# Patient Record
Sex: Male | Born: 1955
Health system: Southern US, Community
[De-identification: ages and names within clinical notes are randomized; demographics above are authoritative.]

## PROBLEM LIST (undated history)

## (undated) DIAGNOSIS — C801 Malignant (primary) neoplasm, unspecified: Secondary | ICD-10-CM

## (undated) DIAGNOSIS — N200 Calculus of kidney: Secondary | ICD-10-CM

## (undated) DIAGNOSIS — E1140 Type 2 diabetes mellitus with diabetic neuropathy, unspecified: Secondary | ICD-10-CM

## (undated) DIAGNOSIS — Z87442 Personal history of urinary calculi: Secondary | ICD-10-CM

## (undated) DIAGNOSIS — G5603 Carpal tunnel syndrome, bilateral upper limbs: Secondary | ICD-10-CM

## (undated) DIAGNOSIS — M1990 Unspecified osteoarthritis, unspecified site: Secondary | ICD-10-CM

## (undated) DIAGNOSIS — E119 Type 2 diabetes mellitus without complications: Secondary | ICD-10-CM

## (undated) DIAGNOSIS — E1144 Type 2 diabetes mellitus with diabetic amyotrophy: Secondary | ICD-10-CM

## (undated) DIAGNOSIS — E032 Hypothyroidism due to medicaments and other exogenous substances: Secondary | ICD-10-CM

## (undated) DIAGNOSIS — C61 Malignant neoplasm of prostate: Secondary | ICD-10-CM

## (undated) DIAGNOSIS — R569 Unspecified convulsions: Secondary | ICD-10-CM

## (undated) DIAGNOSIS — R351 Nocturia: Secondary | ICD-10-CM

## (undated) DIAGNOSIS — I2510 Atherosclerotic heart disease of native coronary artery without angina pectoris: Secondary | ICD-10-CM

## (undated) DIAGNOSIS — Z973 Presence of spectacles and contact lenses: Secondary | ICD-10-CM

## (undated) DIAGNOSIS — Z87898 Personal history of other specified conditions: Secondary | ICD-10-CM

## (undated) DIAGNOSIS — E785 Hyperlipidemia, unspecified: Secondary | ICD-10-CM

## (undated) DIAGNOSIS — E291 Testicular hypofunction: Secondary | ICD-10-CM

## (undated) HISTORY — DX: Type 2 diabetes mellitus with diabetic amyotrophy: E11.44

## (undated) HISTORY — DX: Atherosclerotic heart disease of native coronary artery without angina pectoris: I25.10

## (undated) HISTORY — DX: Hypothyroidism due to medicaments and other exogenous substances: E03.2

## (undated) HISTORY — DX: Type 2 diabetes mellitus with diabetic neuropathy, unspecified: E11.40

## (undated) HISTORY — DX: Hyperlipidemia, unspecified: E78.5

## (undated) HISTORY — PX: BRAIN SURGERY: SHX531

---

## 1998-03-20 DIAGNOSIS — I776 Arteritis, unspecified: Secondary | ICD-10-CM

## 1998-03-20 HISTORY — DX: Arteritis, unspecified: I77.6

## 1998-04-06 ENCOUNTER — Other Ambulatory Visit: Admission: RE | Admit: 1998-04-06 | Discharge: 1998-04-06 | Payer: Self-pay | Admitting: Otolaryngology

## 1998-06-24 ENCOUNTER — Encounter: Payer: Self-pay | Admitting: Emergency Medicine

## 1998-06-24 ENCOUNTER — Inpatient Hospital Stay (HOSPITAL_COMMUNITY): Admission: EM | Admit: 1998-06-24 | Discharge: 1998-07-01 | Payer: Self-pay | Admitting: Emergency Medicine

## 1998-06-25 ENCOUNTER — Encounter: Payer: Self-pay | Admitting: Neurology

## 1998-07-20 ENCOUNTER — Encounter: Payer: Self-pay | Admitting: Neurology

## 1998-07-20 ENCOUNTER — Inpatient Hospital Stay (HOSPITAL_COMMUNITY): Admission: AD | Admit: 1998-07-20 | Discharge: 1998-07-23 | Payer: Self-pay | Admitting: Neurology

## 1998-07-27 ENCOUNTER — Inpatient Hospital Stay (HOSPITAL_COMMUNITY): Admission: EM | Admit: 1998-07-27 | Discharge: 1998-07-28 | Payer: Self-pay | Admitting: Emergency Medicine

## 1998-11-19 HISTORY — PX: PROSTATE BIOPSY: SHX241

## 1999-12-16 ENCOUNTER — Emergency Department (HOSPITAL_COMMUNITY): Admission: EM | Admit: 1999-12-16 | Discharge: 1999-12-16 | Payer: Self-pay | Admitting: Emergency Medicine

## 1999-12-16 ENCOUNTER — Encounter: Payer: Self-pay | Admitting: Emergency Medicine

## 1999-12-22 ENCOUNTER — Ambulatory Visit (HOSPITAL_COMMUNITY): Admission: RE | Admit: 1999-12-22 | Discharge: 1999-12-22 | Payer: Self-pay | Admitting: Neurology

## 2001-07-08 ENCOUNTER — Ambulatory Visit (HOSPITAL_BASED_OUTPATIENT_CLINIC_OR_DEPARTMENT_OTHER): Admission: RE | Admit: 2001-07-08 | Discharge: 2001-07-08 | Payer: Self-pay | Admitting: Orthopedic Surgery

## 2001-09-09 ENCOUNTER — Emergency Department (HOSPITAL_COMMUNITY): Admission: EM | Admit: 2001-09-09 | Discharge: 2001-09-09 | Payer: Self-pay | Admitting: *Deleted

## 2002-01-06 ENCOUNTER — Encounter: Admission: RE | Admit: 2002-01-06 | Discharge: 2002-01-06 | Payer: Self-pay | Admitting: Orthopedic Surgery

## 2002-01-06 ENCOUNTER — Encounter: Payer: Self-pay | Admitting: Orthopedic Surgery

## 2003-03-12 ENCOUNTER — Emergency Department (HOSPITAL_COMMUNITY): Admission: EM | Admit: 2003-03-12 | Discharge: 2003-03-12 | Payer: Self-pay | Admitting: Emergency Medicine

## 2003-07-04 ENCOUNTER — Ambulatory Visit (HOSPITAL_BASED_OUTPATIENT_CLINIC_OR_DEPARTMENT_OTHER): Admission: RE | Admit: 2003-07-04 | Discharge: 2003-07-04 | Payer: Self-pay | Admitting: Otolaryngology

## 2003-10-03 ENCOUNTER — Emergency Department (HOSPITAL_COMMUNITY): Admission: EM | Admit: 2003-10-03 | Discharge: 2003-10-03 | Payer: Self-pay | Admitting: *Deleted

## 2003-10-06 ENCOUNTER — Ambulatory Visit (HOSPITAL_BASED_OUTPATIENT_CLINIC_OR_DEPARTMENT_OTHER): Admission: RE | Admit: 2003-10-06 | Discharge: 2003-10-06 | Payer: Self-pay | Admitting: Urology

## 2005-01-23 ENCOUNTER — Ambulatory Visit: Payer: Self-pay | Admitting: Internal Medicine

## 2005-02-14 ENCOUNTER — Encounter (INDEPENDENT_AMBULATORY_CARE_PROVIDER_SITE_OTHER): Payer: Self-pay | Admitting: Specialist

## 2005-02-14 ENCOUNTER — Ambulatory Visit: Payer: Self-pay | Admitting: Internal Medicine

## 2005-03-20 DIAGNOSIS — I219 Acute myocardial infarction, unspecified: Secondary | ICD-10-CM

## 2005-03-20 HISTORY — DX: Acute myocardial infarction, unspecified: I21.9

## 2005-06-18 DIAGNOSIS — I2510 Atherosclerotic heart disease of native coronary artery without angina pectoris: Secondary | ICD-10-CM

## 2005-06-18 HISTORY — DX: Atherosclerotic heart disease of native coronary artery without angina pectoris: I25.10

## 2005-06-22 ENCOUNTER — Inpatient Hospital Stay (HOSPITAL_COMMUNITY): Admission: EM | Admit: 2005-06-22 | Discharge: 2005-06-26 | Payer: Self-pay | Admitting: Emergency Medicine

## 2005-06-22 ENCOUNTER — Ambulatory Visit: Payer: Self-pay | Admitting: Internal Medicine

## 2005-06-22 DIAGNOSIS — I252 Old myocardial infarction: Secondary | ICD-10-CM

## 2005-06-22 DIAGNOSIS — Z955 Presence of coronary angioplasty implant and graft: Secondary | ICD-10-CM

## 2005-06-22 HISTORY — DX: Presence of coronary angioplasty implant and graft: Z95.5

## 2005-06-22 HISTORY — DX: Old myocardial infarction: I25.2

## 2005-06-22 HISTORY — PX: CARDIAC CATHETERIZATION: SHX172

## 2005-06-23 ENCOUNTER — Encounter: Payer: Self-pay | Admitting: Cardiology

## 2005-07-03 ENCOUNTER — Ambulatory Visit: Payer: Self-pay | Admitting: Internal Medicine

## 2005-08-07 ENCOUNTER — Ambulatory Visit: Payer: Self-pay | Admitting: Internal Medicine

## 2005-10-27 ENCOUNTER — Ambulatory Visit: Payer: Self-pay | Admitting: Internal Medicine

## 2006-03-23 ENCOUNTER — Ambulatory Visit: Payer: Self-pay | Admitting: Internal Medicine

## 2006-04-03 ENCOUNTER — Ambulatory Visit: Payer: Self-pay

## 2006-04-03 ENCOUNTER — Ambulatory Visit: Payer: Self-pay | Admitting: Internal Medicine

## 2006-04-03 LAB — CONVERTED CEMR LAB
LDL Cholesterol: 54 mg/dL (ref 0–99)
Total CHOL/HDL Ratio: 3
Triglycerides: 70 mg/dL (ref 0–149)
VLDL: 14 mg/dL (ref 0–40)

## 2006-12-03 ENCOUNTER — Ambulatory Visit: Payer: Self-pay | Admitting: Internal Medicine

## 2006-12-11 ENCOUNTER — Ambulatory Visit: Payer: Self-pay

## 2006-12-21 ENCOUNTER — Ambulatory Visit: Payer: Self-pay | Admitting: Cardiology

## 2006-12-21 ENCOUNTER — Ambulatory Visit (HOSPITAL_COMMUNITY): Admission: RE | Admit: 2006-12-21 | Discharge: 2006-12-21 | Payer: Self-pay | Admitting: Cardiology

## 2007-01-03 ENCOUNTER — Ambulatory Visit: Payer: Self-pay | Admitting: Internal Medicine

## 2007-07-08 ENCOUNTER — Ambulatory Visit: Payer: Self-pay | Admitting: Internal Medicine

## 2007-07-09 ENCOUNTER — Encounter: Payer: Self-pay | Admitting: Internal Medicine

## 2007-07-09 ENCOUNTER — Ambulatory Visit: Payer: Self-pay

## 2007-07-19 ENCOUNTER — Ambulatory Visit: Payer: Self-pay | Admitting: Internal Medicine

## 2007-07-19 LAB — CONVERTED CEMR LAB
Potassium: 4.1 meq/L (ref 3.5–5.1)
Sodium: 138 meq/L (ref 135–145)

## 2007-07-23 ENCOUNTER — Ambulatory Visit: Payer: Self-pay | Admitting: Cardiovascular Disease

## 2007-07-23 ENCOUNTER — Ambulatory Visit (HOSPITAL_COMMUNITY): Admission: RE | Admit: 2007-07-23 | Discharge: 2007-07-23 | Payer: Self-pay | Admitting: Internal Medicine

## 2007-07-23 DIAGNOSIS — Q248 Other specified congenital malformations of heart: Secondary | ICD-10-CM

## 2007-07-23 HISTORY — DX: Other specified congenital malformations of heart: Q24.8

## 2007-11-20 ENCOUNTER — Ambulatory Visit: Payer: Self-pay | Admitting: Urology

## 2008-01-13 ENCOUNTER — Ambulatory Visit: Payer: Self-pay | Admitting: Internal Medicine

## 2008-02-11 ENCOUNTER — Ambulatory Visit: Payer: Self-pay | Admitting: Internal Medicine

## 2008-02-12 ENCOUNTER — Inpatient Hospital Stay (HOSPITAL_COMMUNITY): Admission: EM | Admit: 2008-02-12 | Discharge: 2008-02-13 | Payer: Self-pay | Admitting: Emergency Medicine

## 2008-02-13 ENCOUNTER — Encounter: Payer: Self-pay | Admitting: Cardiology

## 2008-02-20 ENCOUNTER — Encounter: Admission: RE | Admit: 2008-02-20 | Discharge: 2008-02-20 | Payer: Self-pay | Admitting: Internal Medicine

## 2008-02-26 ENCOUNTER — Ambulatory Visit: Payer: Self-pay | Admitting: Internal Medicine

## 2008-02-26 DIAGNOSIS — I2510 Atherosclerotic heart disease of native coronary artery without angina pectoris: Secondary | ICD-10-CM | POA: Insufficient documentation

## 2008-02-26 DIAGNOSIS — E785 Hyperlipidemia, unspecified: Secondary | ICD-10-CM | POA: Insufficient documentation

## 2008-02-26 DIAGNOSIS — E032 Hypothyroidism due to medicaments and other exogenous substances: Secondary | ICD-10-CM | POA: Insufficient documentation

## 2008-04-07 ENCOUNTER — Ambulatory Visit: Payer: Self-pay

## 2008-06-22 ENCOUNTER — Ambulatory Visit: Payer: Self-pay | Admitting: Internal Medicine

## 2008-06-22 ENCOUNTER — Encounter: Payer: Self-pay | Admitting: Internal Medicine

## 2008-08-12 ENCOUNTER — Telehealth: Payer: Self-pay | Admitting: Internal Medicine

## 2008-08-14 ENCOUNTER — Encounter: Payer: Self-pay | Admitting: Internal Medicine

## 2009-01-07 ENCOUNTER — Ambulatory Visit: Payer: Self-pay | Admitting: Internal Medicine

## 2009-01-07 DIAGNOSIS — E669 Obesity, unspecified: Secondary | ICD-10-CM | POA: Insufficient documentation

## 2009-01-07 DIAGNOSIS — I318 Other specified diseases of pericardium: Secondary | ICD-10-CM | POA: Insufficient documentation

## 2009-01-25 ENCOUNTER — Telehealth: Payer: Self-pay | Admitting: Internal Medicine

## 2009-02-05 ENCOUNTER — Telehealth: Payer: Self-pay | Admitting: Internal Medicine

## 2009-04-19 ENCOUNTER — Ambulatory Visit: Payer: Self-pay | Admitting: Internal Medicine

## 2009-04-22 LAB — CONVERTED CEMR LAB
Cholesterol: 104 mg/dL (ref 0–200)
HDL: 31.4 mg/dL — ABNORMAL LOW (ref >39.00)
LDL Cholesterol: 42 mg/dL (ref 0–99)
Total CHOL/HDL Ratio: 3
VLDL: 30.4 mg/dL (ref 0.0–40.0)

## 2009-07-01 ENCOUNTER — Ambulatory Visit: Payer: Self-pay | Admitting: General Practice

## 2009-08-12 ENCOUNTER — Encounter: Payer: Self-pay | Admitting: Internal Medicine

## 2009-08-19 ENCOUNTER — Encounter: Payer: Self-pay | Admitting: Internal Medicine

## 2009-11-15 ENCOUNTER — Ambulatory Visit: Payer: Self-pay | Admitting: Internal Medicine

## 2010-04-19 NOTE — Miscellaneous (Signed)
Summary: crestor don daj  Clinical Lists Changes  Medications: Rx of CRESTOR 10 MG TABS (ROSUVASTATIN CALCIUM) 1 tablet every day before bedtime;  #30 x 6;  Signed;  Entered by: Burnett Kanaris, CNA;  Authorized by: Sherrill Raring, MD, [facility];  Method used: Electronically to [facility] [address] # [id number]*, [address] [place], [place], [place]  [address], Ph: [phone] or [phone], Fax: [phone]    Prescriptions: CRESTOR 10 MG TABS (ROSUVASTATIN CALCIUM) 1 tablet every day before bedtime  #30 x 6   Entered by:   Burnett Kanaris, CNA   Authorized by:   Sherrill Raring, MD, [facility]   Signed by:   Burnett Kanaris, CNA on 08/12/2009   Method used:   Electronically to        [facility]  [id number]* (retail)       [address]       [place], [place]  [address]       Ph: [phone] or [phone]       Fax: [phone]   RxID:   [id number]

## 2010-04-19 NOTE — Assessment & Plan Note (Signed)
Summary: f89m/jss   Visit Type:  Follow-up Primary [name]:  [name]  CC:  none.  History of Present Illness: [name] is a 55 year old gentleman, who has a history of coronary artery disease, hypothyroidism, dyslipidemia, diabetes, pericardial cyst. I last saw him back in October of 2010. He is status post ST elevation MI in April 2007 has 3 bare-metal stents to the circumflex. Repeat catheterization 2009 showed a 60-65% mid RCA stenosis; small RV branch with 80% stenosis. D2 had a 70% ostial lesion. D1 40%. The stents in the LCX had 50% instent proximally.LVEF 55% overall unchanged. Since seen he denies chest pain.  He has been under increased stress having separated from his wife.  Breathing is good.  He is not traveling quite as much.  Not walking regularly.  Current Medications (verified): 1)  Keppra 750 Mg Tabs (Levetiracetam) .... One By Mouth Two Times A Day 2)  Plavix 75 Mg Tabs (Clopidogrel Bisulfate) .... One By Mouth Daily 3)  Metformin Hcl 500 Mg Tabs (Metformin Hcl) .... One By Mouth Two Times A Day 4)  Aspirin 81 Mg Tbec (Aspirin) .... Take One Tablet By Mouth Daily 5)  Metoprolol Succinate 25 Mg Xr24h-Tab (Metoprolol Succinate) .... One By Mouth Daily 6)  Synthroid 150 Mcg Tabs (Levothyroxine Sodium) .... One By Mouth Dialy 7)  Crestor 10 Mg Tabs (Rosuvastatin Calcium) .Marland Kitchen.. 1 Tablet Every Day Before Bedtime  Allergies (verified): No Known Drug Allergies  Past History:  Past medical, surgical, family and social histories (including risk factors) reviewed, and no changes noted (except as noted below).  Past Medical History: Reviewed history from 02/26/2008 and no changes required. 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 Jasper Loser Diabetes Type 2 Pericardial cyst  Family History: Reviewed history from  02/26/2008 and no changes required. Father:MI & CABG Mother:died 81 MI  Social History: Reviewed history from 02/26/2008 and no changes required. Married  Tobacco Use - No.  Alcohol Use - yes Regular Exercise - no  Review of Systems       All systems reviewed.  Neg to the above problem.  Vital Signs:  Patient profile:   55 year old male Height:      71 inches Weight:      257 pounds BMI:     35.97 Pulse rate:   72 / minute BP sitting:   121 / 75  (left arm) Cuff size:   large  Vitals Entered By: Burnett Kanaris, CNA (November 15, 2009 2:37 PM)  Physical Exam  Additional Exam:  Patient is in NAD HEENT:  Normocephalic, atraumatic. EOMI, PERRLA.  Neck: JVP is normal. No thyromegaly. No bruits.  Lungs: clear to auscultation. No rales no wheezes.  Heart: Regular rate and rhythm. Normal S1, S2. No S3.   No significant murmurs. PMI not displaced.  Abdomen:  OBese.Supple, nontender. Normal bowel sounds. No masses. No hepatomegaly.  Extremities:   Good distal pulses throughout. No lower extremity edema.  Musculoskeletal :moving all extremities.  Neuro:   alert and oriented x3.    EKG  Procedure date:  11/15/2009  Findings:      NSR.  75 bpm.  Inf MI  Impression & Recommendations:  Problem # 1:  CAD, NATIVE VESSEL (ICD-414.01) Doing well.  No pains.  I would keep him on the same regimen.  I again will review plavix but with multiple stents (even though BMS) and mld instent restenosis would continue  for now. Encouraged him to increase exercise, watch diet. Will get records from Dr. Lorain Childes F/U in 1 year.  Problem # 2:  HYPERLIPIDEMIA-MIXED (ICD-272.4) check last lipid panel in June.  Problem # 3:  OBESITY, UNSPECIFIED (ICD-278.00) COunsellede

## 2010-08-02 NOTE — Assessment & Plan Note (Signed)
[facility]                            CARDIOLOGY OFFICE NOTE   [name], [name]                       MRN:          [id number]  DATE:01/13/2008                            DOB:          [birth year]    IDENTIFICATION:  [name] is a 55 year old gentleman, I last saw him in  April of this year.  He was initially referred by Dr. Jacky Oconnor for an  abnormal CT, found to have a pericardial cyst.   Since seen, the patient has done okay.  He is still very busy at work,  not getting a lot of exercise, notes occasional chest pains, but when he  is lying in bed, not with activity.  [place], [place] and walks 5 miles at a time without a problem.  He denies  palpitations.   CURRENT MEDICINES:  1. Keppra 750 b.i.d.  2. Synthroid 0.112 alternating with 0.15 daily.  3. Plavix 75.  4. Aspirin 81.  5. Vytorin 10/40.  6. Metoprolol 12.5 b.i.d.  7. Metformin 500 b.i.d.   PHYSICAL EXAMINATION:  GENERAL:  The patient is an obese, 55 year old,  in no distress.  VITAL SIGNS:  Blood pressure is 126/84, pulse is 81.  Weight is 276.  NECK:  No bruits.  LUNGS:  Clear.  No rales.  CARDIAC:  Regular rate and rhythm.  S1 and S2.  No S3, no significant  murmurs.  ABDOMEN:  Benign, obese.  EXTREMITIES:  No edema.   IMPRESSION:  Coronary artery disease.  Catheterization in October 2008  shows patent stents.  The right coronary artery was 50% focal and mid  narrowing, small right ventricular branch with an 80% narrowing.  Left  anterior descending (coronary artery) had a 30% D1, which was small had  a 80% proximal and 50% mid stenosis.  Circumflex has 3 stents with 50%  narrowing throughout.   PLAN:  1. Medical therapy.  Sounds like he is asymptomatic.  I would not      change his regimen.  I have told him to increase his activity.  2. Dyslipidemia.  We will need to get the records from Dr. Lanell Oconnor      office.  3. Diabetes, now on metformin.   To have followup with Dr. Jacky Oconnor.  4. Pericardial cyst.  I will follow clinically now.  There is no      evidence that this should progress.   ADDENDUM:  The patient will be set to be seen in 6-8 months.   We will also refer him to Dietary.  I encouraged weight loss.  He is  thinking of joining [employer] with his children, which I  applauded.     Pricilla Riffle, MD, [facility]  Electronically Signed    PVR/MedQ  DD: 01/13/2008  DT: 01/14/2008  Job #: [id number]   cc:   [name], M.D.

## 2010-08-02 NOTE — Cardiovascular Report (Signed)
[name], DELILLO                ACCOUNT NO.:  [id number]   MEDICAL RECORD NO.:  [id number]          PATIENT TYPE:  OIB   LOCATION:  2855                         FACILITY:  [facility]   PHYSICIAN:  Arturo Morton. Riley Kill, MD, FACCDATE OF BIRTH:  10-02-1955   DATE OF PROCEDURE:  DATE OF DISCHARGE:  12/21/2006                            CARDIAC CATHETERIZATION   INDICATIONS:  Mr. Preece is a very nice 55 year old gentleman who  previously underwent stenting with three stents in the circumflex  coronary artery.  The OM1 was occluded but the large remaining  circumflex was opened successfully, with successful reperfusion therapy.  He has done well.  He has had some shortness of breath, but has gained  some weight and has been less active.  Current study was done after a  radionuclide imaging study revealed some mild lateral ischemia.  He was  brought to the lab for further evaluation.   PROCEDURE:  1. Left heart catheterization.  2. Selective coronary arteriorrhaphy.  3. Selective left ventriculography.   DESCRIPTION OF PROCEDURE:  The patient was brought to the  catheterization laboratory, prepped and draped in usual fashion.  Through an anterior puncture, the right femoral artery was easily  entered.  A 5-French sheath was placed.  A left coronary arteriography  was performed with a JL-3 catheter.  A standard JR-4 was used for the  right coronary artery.  Central aortic left atrial pressures were  measured with pigtail. Ventriculography was then performed in the RAO  projection.  The patient tolerated procedure well, and there were no  complications. He was taken to the holding area in satisfactory clinical  condition.  I carefully compared the films with his previous study.  I  then discussed this with the family.   HEMODYNAMIC DATA:  1. Central aortic pressure 117/76, mean 95.  2. Left atrial pressure 106/12.  3. No gradient or pullback across the aortic valve.   ANGIOGRAPHIC DATA:  1. Ventriculography was done in the RAO projection.  Estimated      ejection fraction will be in the range of 55%.  There was very      minimal inferobasal hypokinesis.  Overall, the LV appeared      preserved.  It did not appear to be significant mitral      regurgitation.  2. The right coronary artery is unchanged from the previous study.      There is about a 50% area of focal narrowing in the mid-vessel.      There is a small right ventricular branch with about 80% narrowing.      There is moderate size posterior descending and a somewhat smaller      posterolateral system, all of which have minimal luminal      irregularity but no critical stenoses.  3. The left main coronary artery is free of critical disease.  4. The left anterior descending artery courses to the apex.  There is      some luminal irregularity throughout the LAD with about 30%      narrowing at the first diagonal and 30% narrowing more  distally.      The very distal portion of the LAD has some diffuse luminal      irregularities.  There is a tiny first diagonal that has about an      80% stenosis proximally and a 50% mid-stenosis.  Second diagonal      probably has about 70% ostial stenosis, but both of these vessels      are quite small.  5. The circumflex provides a tiny first marginal and then has three      long stents going into what was a large, likely OM3.  The stented      vessel has a smooth 50% area of narrowing at the entry of the stent      that is over about 10-mm length.  Following this, the vessel opens      up, and the stents all distally are widely patent.  There is      probably less than 20% narrowing throughout the remainder of the      stents.  The first marginal, as previously noted, fills by      retrograde collaterals.  The stump in the first marginal was never      found during the acute study, and this current study demonstrates      reasonably good collateralization of the first marginal  retrograde.   CONCLUSIONS:  1. Preserved overall left ventricular function with a mild area of      inferobasal hypokinesis.  2. Continued patency of previously placed stents with a 50% area of      smooth entry stenosis, and widely patent stents beyond this area.  3. Collateralization of the first marginal branches on the previous      study.  4. No real significant change in the LAD or right coronaries in the      previous study.   DISPOSITION:  At the present time, the patient is getting ready to  enroll in the cardiac rehab program.  This would be ideal for him.  Continued medical therapy would be indicated.  The circumflex vessel  appears smooth at this point in time.  The stent is widely patent except  for the entry point, and this appears to be only about 50% narrowing.  Given the time frame after the original percutaneous intervention, this  would likely represent a mature and fairly healed area.  Therefore, a  repeat intervention at present would likely not be necessary.  He would  also run the risk of repeat restenosis.  We will continue to follow him  with Dr. Tenny Craw, and should there be a change in his status during  rehabilitation, then had some consideration could be given to a  dilatation of this area.      Arturo Morton. Riley Kill, MD, [facility]  Electronically Signed     TDS/MEDQ  D:  12/21/2006  T:  12/22/2006  Job:  [id number]   cc:   Arturo Morton. Riley Kill, MD, [facility]  Pricilla Riffle, MD, [facility]  Patient Medical Record  Geoffry Paradise, M.D.

## 2010-08-02 NOTE — H&P (Signed)
[name], [name]                ACCOUNT NO.:  [id number]   MEDICAL RECORD NO.:  [id number]          PATIENT TYPE:  OBV   LOCATION:  3703                         FACILITY:  [facility]   PHYSICIAN:  Bevelyn Buckles. Bensimhon, MDDATE OF BIRTH:  04/13/1955   DATE OF ADMISSION:  02/11/2008  DATE OF DISCHARGE:                              HISTORY & PHYSICAL   PRIMARY CARE PHYSICIAN:  Dr. Jacky Kindle.   PRIMARY CARDIOLOGIST:  Pricilla Riffle, MD, [facility]   CHIEF COMPLAINT:  Chest pain.   HISTORY OF PRESENT ILLNESS:  [name] is a 55 year old male with a  history of coronary artery disease.  He has occasional chest pain 1-2  times a week.  It resolves without intervention.  Last p.m. at  approximately 11 p.m., he had onset of left-sided chest pain at rest.  This is his usual chest pain, but it was worse than usual and reached in  8/10.  He describes it as a tightness and states it radiated to his left  shoulder.  It was associated with shortness of breath and slight  diaphoresis, but no nausea or vomiting.  He took a sublingual  nitroglycerin at 1:30 a.m., which decreased his symptoms and he was able  to sleep.  At 4:30, he was awakened by chest pain and took a second  nitroglycerin, which decreased his symptoms to a 6/10.  He called our  office this morning and came to the emergency room as directed.  He has  not had any exertional symptoms recently.  By his own admission because  of back problems and because he does work behind [employer], he exerts  himself very little.  He is currently complaining of chest pain at a  6/10.   PAST MEDICAL HISTORY:  1. ST-elevation MI in April 2007 with 3 bare-mental stents to the      circumflex.  2. History of chest pain with cardiac catheterization in October 2008      showing an EF of 55%, left main okay, LAD 30%, first diagonal 80%      and 50%, second diagonal 70% (both small vessels), circumflex 50%      at the proximal stent edge, and the OM1 was totaled, but  filled by      collateral circulation, medical therapy recommended.  3. Preserved left ventricular function with an EF of 55% in cath.  4. Hypertension.  5. Hyperlipidemia.  6. Obesity.  7. Borderline diabetes.  8. History of a seizure disorder secondary to cerebral vasculitis.  9. Hypothyroidism.   SURGICAL HISTORY:  He is status post cardiac catheterization as well as  right shoulder surgery and cystoscopy.   ALLERGIES:  No known drug allergies.   CURRENT MEDICATIONS:  1. Keppra 750 mg b.i.d.  2. Synthroid 0.15 mg daily.  3. Aspirin 81 mg a day.  4. Plavix 75 mg a day.  5. Metoprolol ER 25 mg a day.  6. Vytorin 10/40 daily.  7. Metformin 500 mg b.i.d.   SOCIAL HISTORY:  He lives in [place] with his wife and works in  Psychologist, clinical.  He has  no history of alcohol, tobacco, or drug abuse.  He  was recently started on metformin by his physician, and he is trying to  eat a low-sugar and carbohydrate heart-healthy diet.   FAMILY HISTORY:  His mother died at age 62 with coronary artery disease  of an MI.  His father is alive in his late 54s with a history of MI and  bypass surgery, but no siblings have heart disease.   REVIEW OF SYSTEMS:  He has had diaphoresis with this episode of chest  pain.  He has some chronic dyspnea on exertion and has not changed  recently.  He snores heavily and occasionally wakes up in the middle of  the night, but we have no report of apnea.  He denies coughing,  wheezing, or syncope.  He has no GI symptoms.  Full 14-point review of  systems is otherwise negative.   PHYSICAL EXAMINATION:  VITAL SIGNS:  Temperature is 98.6, blood pressure  138/84, pulse 86, respiratory rate 20, and O2 saturation 98% on 2 L.  GENERAL:  He is a well-developed, obese white male in no obvious  distress.  HEENT:  Normal.  NECK:  There is no lymphadenopathy, thyromegaly, bruit, or JVD noted.  CV:  His heart is regular in rate and rhythm with an S1 and S2 and a  soft  murmur is noted.  Distal pulses are intact in all 4 extremities and  no femoral bruits are appreciated.  LUNGS:  Essentially clear to auscultation bilaterally.  SKIN:  No rashes or lesions are noted.  ABDOMEN:  Soft and nontender with active bowel sounds.  EXTREMITIES:  There is no cyanosis, clubbing, or edema noted.  MUSCULOSKELETAL:  There is no joint deformity or effusions, and no spine  or CVA tenderness is noted.  NEUROLOGIC:  He is alert and oriented with cranial nerves II through XII  grossly intact.   Chest X-Ray:  No active disease.   LABORATORY VALUES:  INR 1.0, PTT 28, and troponin I less than 0.01.  Hemoglobin 15.2, hematocrit 45, WBC 6.2, and platelets 147.  Sodium 138,  potassium 3.8, chloride 106, CO2 of 23, BUN 7, creatinine 1.01, and  glucose 226.  Hemoglobin A1c and TSH pending.  Total cholesterol 100,  triglycerides 161, HDL 25, and LDL pending.   EKG:  Sinus rhythm, rate 72 with no acute ischemic changes.   IMPRESSION:  [name] was seen today by Dr. Gala Romney.  He is a 52-year-  old male with a history of coronary artery disease, treated medically  who is here with crescendo-type chest pain.  His symptoms are improved  by nitroglycerin.  He has been started on IV nitroglycerin in the  emergency room and we will add heparin as well.  Dr. Gala Romney discussed  cardiac catheterization with [name] and his wife, including the risks  and benefits of the  procedure.  Dr. Gala Romney recommends this procedure, and the patient and  his wife agreed to proceed.  He has been scheduled for cardiac  catheterization this afternoon.  Further evaluation and treatment will  depend on the results of the above testing, and he will be continued on  his home medications with the exception of the metformin.      Theodore Demark, PA-C      Bevelyn Buckles. Bensimhon, MD  Electronically Signed    RB/MEDQ  D:  02/11/2008  T:  02/12/2008  Job:  [id number]   cc:   Dr. Jacky Kindle

## 2010-08-02 NOTE — Assessment & Plan Note (Signed)
[facility]                            CARDIOLOGY OFFICE NOTE   [name], [name]                       MRN:          [id number]  DATE:02/26/2008                            DOB:          1956/01/23    PRIMARY CARDIOLOGIST:  Pricilla Riffle, MD, [facility]   PRIMARY CARE PHYSICIAN:  Geoffry Paradise, MD   HISTORY:  This is a pleasant 55 year old married white male patient who  has a history of coronary artery disease, status post ST elevation MI in  April 2007, treated with 3 bare-metal stents to the circumflex.  He  presented with recurrent chest pain on February 12, 2008, and had repeat  cardiac cath by Dr. Clifton James.  He was found to have a RCA with 60-65%  mid stenosis and a small RV branch with an 80% narrowing, LV function  was 55%.  He had patent circumflex stents.  There was second diagonal  that had a 70% ostial stenosis unchanged from prior cath.  He had 40%  first diagonal and 40% second diagonal.   Dr. Clifton James did not think his chest pain was coming from his coronary  artery disease and recommended ongoing medical therapy.  He says if he  had recurrent chest pain, we should perform a stress test to localize  ischemia.   Since the patient has been at home, he says at weekend he had the chest  pain.  He had been raking leaves and felt like he had a pulled muscle  that went into his chest.  Since his cath he has not had any further  chest pain.  He said he had a long discussion with Dr. Jacky Kindle  concerning his weight and he is committed to joining the [employer] in getting  on a strong exercise program and losing weight.  He was recently  diagnosed with diabetes and would like to come off this medication as  well.   CURRENT MEDICATIONS:  1. Keppra 750 mg b.i.d.  2. Vytorin 10/40 mg daily.  3. Metformin 500 mg b.i.d.  4. Synthroid 150 mcg daily.  5. Aspirin 81 mg daily.  6. Metoprolol 25 mg daily.  7. Amoxicillin for dental.  8. Tylenol for  dental.   PHYSICAL EXAMINATION:  GENERAL:  This is a pleasant, obese, 55 year old  white male in no acute distress.  VITAL SIGNS:  Blood pressures 114/78 and pulse 69 way to 70.  NECK:  Without JVD, HJR, bruit, or thyroid enlargement.  LUNGS:  Clear anterior, posterior, and lateral.  HEART:  Regular rate and rhythm at 70 beats per minute.  Normal S1 and  S2.  No murmur, rub, bruit, thrill, or heave noted.  ABDOMEN:  Obese.  Normoactive bowel sounds throughout.  His right groin without hematoma  or hemorrhage.  EXTREMITIES:  Lower extremities without cyanosis or edema. He has good  distal pulses.   IMPRESSION:  1. Chest pain felt to be musculoskeletal.  Cardiac cath on February 12, 2008, revealed patent circumflex stents, 60-65% right coronary      artery, normal left ventricular function,  40% left anterior      descending, and 70% small second diagonal unchanged from prior      cath.  2. Status post acute myocardial infarction in April 2007, treated with      3 bare-metal stents to the circumflex.  3. Hypertension.  4. Hyperlipidemia.  5. Obesity.  6. Borderline diabetes mellitus.  7. History of seizure disorder secondary to cerebral vasculitis.  8. Hypothyroidism.   PLAN:  The patient is doing quite well from a cardiac standpoint.  He  has had no further chest pain.  He is motivated to lose weight.  He has  joined [employer], and has already lost 6 pounds since he was seen in  October.  He will see Dr. Dietrich Pates back in April or sooner if needed.      Paul Reedy, PA-C  Electronically Signed      Hillis Range, MD  Electronically Signed   ML/MedQ  DD: 02/26/2008  DT: 02/27/2008  Job #: [id number]

## 2010-08-02 NOTE — Cardiovascular Report (Signed)
[name], [name]                ACCOUNT NO.:  [id number]   MEDICAL RECORD NO.:  [id number]          PATIENT TYPE:  INP   LOCATION:  3703                         FACILITY:  [facility]   PHYSICIAN:  Verne Carrow, MDDATE OF BIRTH:  [birth year]   DATE OF PROCEDURE:  02/12/2008  DATE OF DISCHARGE:                            CARDIAC CATHETERIZATION   PRIMARY CARDIOLOGIST:  Pricilla Riffle, MD, [facility]   PROCEDURE PERFORMED:  1. Left heart catheterization.  2. Selective coronary angiography.  3. Left ventricular angiogram.   OPERATOR:  Verne Carrow, MD   INDICATIONS:  Chest pain that is concerning for unstable angina in a  patient with known coronary artery disease with prior stent placement in  the circumflex artery during an acute MI in April 2007.  The patient's  last heart catheterization was performed by Dr. Shawnie Pons on  December 21, 2006, and demonstrated patent stents in the circumflex and  nonobstructive disease in other vessels.  The patient was admitted and  ruled out for myocardial infarction.   ANGIOGRAPHIC FINDINGS:  1. The left main coronary artery bifurcates into the circumflex and      LAD and has no evidence of disease.  2. The left anterior descending artery courses to the apex.  There is      some luminal irregularity throughout the LAD with a 40% stenosis at      the first diagonal and another 40% stenosis at the second diagonal.      The very distal portion of the LAD has some diffuse luminal      irregularities.  The first diagonal is a small-caliber vessel that      has a 60% ostial stenosis and a 40% stenosis in the mid body of      this vessel.  The second diagonal is a small-to-moderate size      vessel that has a 70% ostial stenosis that is unchanged from prior      catheterization.  3. The circumflex artery has small second and third obtuse marginal      branch that is free of disease.  There are prior stents noted in      the proximal  circumflex, mid circumflex, and extending into a large      fourth obtuse marginal branch.  The first obtuse marginal branch is      seen to fill from left collaterals.  This has been noted on prior      catheterizations.  There is a 50% stenosis at the very beginning of      the stented portion in the proximal circumflex.  This is within the      stented segment and is unchanged from the prior catheterization.      There is minimal in-stent restenosis noted in the more distal      portion of the stented segment in the fourth obtuse marginal      branch.  4. The right coronary artery is mostly unchanged from the previous      study.  There is a 60-65% stenosis in the mid vessel.  This is  a      large dominant vessel.  There is a small right ventricular branch      with an 80% narrowing.  There is a moderate size posterior      descending branch and a smaller posterolateral system.  All of      which have minimal luminal irregularities, but no critical      stenosis.  5. Left ventricular angiogram demonstrated normal wall motion with an      ejection fraction estimated at 55%.   HEMODYNAMIC DATA:  Central aortic pressure 139/89.  Left ventricular  pressure 144/19.  End-diastolic pressure 26.   IMPRESSION:  1. Stable triple-vessel native coronary artery disease.  2. Patent stents in the circumflex artery with no change in the in-      stent restenosis.  3. Preserved left ventricular function.   RECOMMENDATIONS:  I recommend continued medical management of this  patient's coronary artery disease.  The right coronary artery lesion is  felt to be 60-65%.  I do not think that this is significantly limiting  flow.  If the patient continues to have angina with exertion, then we  could consider performing a stress test to localize ischemia.      Verne Carrow, MD  Electronically Signed     CM/MEDQ  D:  02/12/2008  T:  02/13/2008  Job:  [id number]   cc:   Pricilla Riffle, MD,  [facility]

## 2010-08-02 NOTE — Discharge Summary (Signed)
[name], [name]                ACCOUNT NO.:  [id number]   MEDICAL RECORD NO.:  [id number]          PATIENT TYPE:  INP   LOCATION:  3703                         FACILITY:  [facility]   PHYSICIAN:  Verne Carrow, MDDATE OF BIRTH:  Nov 22, 1955   DATE OF ADMISSION:  02/11/2008  DATE OF DISCHARGE:  02/13/2008                               DISCHARGE SUMMARY   PRIMARY CARDIOLOGIST:  Pricilla Riffle, MD, [facility]   DISCHARGE DIAGNOSIS:  Chest pain/back pain status post cardiac  catheterization, showing stable multivessel disease with patent stent to  circumflex, preserved left ventricular function with recommendations for  continued medical management at this time.  The patient ruled out for  myocardial infarction by serial enzymes and EKG.   PAST MEDICAL HISTORY:  1. Coronary artery disease.  2. Hypertension.  3. Dyslipidemia.  4. Obesity.  5. Diabetes.  6. Seizure disorder.  7. Hypothyroidism.   HOSPITAL COURSE:  Mr. Ganaway is a 55 year old gentleman with known history  of coronary artery disease who presented complaining of chest discomfort  and associated back discomfort.  The patient was admitted, ruled out  myocardial infarction by EKG; however, symptoms felt to be concerning  for unstable angina.  The patient to the cath lab on February 12, 2008,  results as stated above.  The patient with diabetes, metformin increased  to 750 mg b.i.d. by Dr. Dietrich Pates.  Dr. Clifton James in to see the patient  on day of discharge.  The patient was stable to be discharged home to  follow up in outpatient.  Office will call the patient at home with  appointment date and time.  The patient was given the post cardiac  catheterization supplemental instruction with instructions to increase  his metformin to 750 mg b.i.d., prescription provided.  The patient was  instructed not to resume his metformin until February 15, 2008.  Other  medications as previously prescribed per the medication reconciliation  form.  At the time of discharge, medication reconciliation form was not  be located.  The patient will continue her medications as listed unless  otherwise prescribed.  1. Keppra 700 mg p.o. b.i.d.  2. Synthroid 150 mcg daily.  3. Plavix 75 daily.  4. Toprol-XL 25 daily.  5. Vytorin 10/40 daily.  6. Metformin as instructed.  7. Nitroglycerin p.r.n.  8. Aspirin 81 mg daily.   DURATION OF DISCHARGE ENCOUNTER:  Less than 30 minutes.      Dorian Pod, ACNP      Verne Carrow, MD  Electronically Signed    MB/MEDQ  D:  03/25/2008  T:  03/26/2008  Job:  [id number]   cc:   Geoffry Paradise, M.D.

## 2010-08-02 NOTE — Assessment & Plan Note (Signed)
[facility]                            CARDIOLOGY OFFICE NOTE   DODD, SCHMID                       MRN:          [id number]  DATE:07/08/2007                            DOB:          [birth year]    IDENTIFICATION:  [name] is a 55 year old gentleman with a history of  CAD.  I last saw him back in October.   In the interval, he was seen actually by [name], was having some  abdominal and back pain.  CAT scan was performed at [facility].  This  was a CT of the abdomen.  No acute findings were in the gallbladder.  No  calculus identified.  Pancreas was normal.  Abdominal aorta was normal.  There was hypodensity on the right kidney.  Most importantly, though, on  some images of the heart there was a 4 x 2 x 1 cm mass in the right  pericardial fat abutting the flora.  Question cystic, questions  recommended further evaluation with an MRI.   On talking to the patient today, his abdominal back complaints have  improved.   He is busy traveling.  His diet is not what he would like it to be.  He  does get a little short of breath with some tightness at times.  There  is no change though from the fall.   CURRENT MEDICINES:  1. Keppra 750 b.i.d.  2. Synthroid 0.112 alternating with 0.15 daily.  3. Plavix 75.  4. Aspirin 325.  5. Vytorin 12/1938.  6. Metoprolol 12.5 b.i.d.   PHYSICAL EXAMINATION:  GENERAL:  The patient is in no distress.  VITAL SIGNS:  Blood pressure 114/76, pulse is 71 and regular, weight  281, up from 278 on last visit.  LUNGS:  Clear.  CARDIAC:  Regular rate and rhythm, S1-S2 no S3 no murmurs.  ABDOMEN:  Benign, obese.  No right upper quadrant tenderness.  EXTREMITIES:  No edema.   IMPRESSION:  1. Abnormal CT.  Discussed with Radiology at [facility].  First I would get      an echocardiogram to evaluate.  May indeed to find the lesion      cystic.  If this is not adequate, will set the patient up for a MRI      to be done  at [facility] since it will be easily accessible.  The patient      agrees to this.  2. Coronary artery disease.  Last catheterization back in October of      last year showed 30% proximal LAD, 50% mid LAD, D1 is a tiny vessel      with 80% lesion, D2 70% ostial lesion, left circumflex had patent      stent with 50% narrowing in the stent site.  Distal stents (three      total) were patent.  RV branch had an 80% narrowing but was small,      RCA had a 50% mid vessel.  I would continue on medical therapy.  3. Dyslipidemia.  Last lipid panel in January, LDL was 54, HDL was 34,  triglycerides 70.  Would continue.  Next time he comes, will get a      Lipo-Met 3.  4. Health care maintenance.  Encouraged him to try to lose weight,      increase his activity level.   I will set follow-up again based on the results of the MRI, tentatively  for fall, if the results appear benign.     Pricilla Riffle, MD, [facility]  Electronically Signed    PVR/MedQ  DD: 07/09/2007  DT: 07/09/2007  Job #: [id number]   cc:   Geoffry Paradise, M.D.

## 2010-08-02 NOTE — Assessment & Plan Note (Signed)
[facility] [place] [facility]                            CARDIOLOGY OFFICE NOTE   [name], [name]                       MRN:          [id number]  DATE:02/26/2008                            DOB:          02-Jan-1956    ADDENDUM   PLAN:  The patient is doing quite well from a cardiac standpoint.  He  has had no further chest pain.  He is motivated to lose weight.  He has  joined [employer], and has already lost 6 pounds since he was seen in  October.  He will see Dr. Dietrich Pates back in April or sooner if needed.      Paul Reedy, PA-C       Hillis Range, MD    ML/MedQ  DD: 02/26/2008  DT: 02/26/2008  Job #: [id number]

## 2010-08-02 NOTE — Assessment & Plan Note (Signed)
[place] [facility]                            CARDIOLOGY OFFICE NOTE   Paul, Oconnor                       MRN:          [id number]  DATE:01/03/2007                            DOB:          10-05-1955    IDENTIFICATION:  [name] is a 55 year old gentleman who I saw back on  the 15th of September.  He had a history of coronary artery disease and  was complaining of increased shortness of breath.  Because of his  history I recommended he have a Myoview scan done, which he had done.  He had abnormal EKG findings and with this and some scar ischemia in  inferior wall I recommended repeat cardiac catheterization.   The patient had his cardiac catheterization done on October 3 by Bonnee Quin.  This showed the left main was normal.  LAD had some  irregularities, 30% proximal, D1 was tiny with an 80% stenosis  proximally, 50% mid stenosis, D2 had a 70% ostial lesion.  Circumflex  was stented and had a 50% narrowing at the stent site.  The distal  stents (3 total) were otherwise patent.  The RCA was unchanged from  previous, there was a small RV branch that had an 80% narrowing.  There  was a 50% mid vessel narrowing.  The first OM from the left circumflex  had filled via collaterals.  LV function was preserved.   Based on these findings, plan was for continued medical therapy and  encouraged that he enroll in cardiac rehab.  If, again, the circumflex  stent was patent except for the entry point that had a 50% narrowing, if  he had more symptoms then consideration would be given to dilatation in  this area.   Since procedure the patient has been doing okay.  He went to the  initiation of cardiac rehab but would like to enter into a program in  the [facility] region which is closer to his home.   CURRENT MEDICATIONS:  1. Keppra 750 b.i.d.  2. Synthroid as directed.  3. Plavix 75.  4. Aspirin 325.  5. Vytorin 10/40.  6. Metoprolol 12.5.   PHYSICAL  EXAM:  Patient is in no distress.  Blood pressure 110/72, pulse  is 69, weight 277.  LUNGS:  Clear.  CARDIAC EXAM:  Regular rate and rhythm, S1 S2, no S3.  ABDOMEN:  Obese, benign.  EXTREMITIES:  Right groin without hematoma.  No edema distally.   IMPRESSION:  1. Coronary artery disease.  As noted above, will enter into cardiac      rehabilitation, will see if we can get him into a program.  2. Dyslipidemia.  Encourage weight loss, exercise.  Again, his LDL is      great at 54, HDL is a little low at 34 but exercise, weight loss      will help.  3. Question sleep apnea.  I will address at next visit.   I would like to see him back in February.  Will be in touch with him  regarding rehab.     Pricilla Riffle,  MD, [facility]  Electronically Signed    PVR/MedQ  DD: 01/03/2007  DT: 01/04/2007  Job #: [id number]   cc:   Geoffry Paradise, M.D.

## 2010-08-02 NOTE — Assessment & Plan Note (Signed)
[place] [facility]                            CARDIOLOGY OFFICE NOTE   PAPE, PARSON                       MRN:          [id number]  DATE:12/03/2006                            DOB:          Jul 21, 1955    IDENTIFICATION:  [name] is a 55 year old gentleman with a history of  coronary artery disease.  I last saw him in January.   Since seen the patient has noted some increased shortness of breath, he  attributes it probably to deconditioning as his weight has gone up and  his activity, physical has gone down some.  He does not always give out  with activity but he says after 15 holes of golf (riding cart) he is  done for the day.  He just walked from here to the hospital and did not  have problems with shortness of breath (father is hospitalized after  having an MI last week, planned for intervention this week).   The patient has increased his snoring per his wife's report, has some  gasping at night but no significant increase.   No chest pain at rest.   CURRENT MEDICATIONS:  1. Keppra 750 b.i.d.  2. Synthroid 1/2 mg daily, 0.15 mg every other day.  3. Plavix 75 daily.  4. Aspirin 325 daily.  5. Vytorin 10/40 daily.  6. Metoprolol 12.5 daily.   PHYSICAL EXAMINATION:  Patient is in no distress, blood pressure is  106/70, pulse is 65, weight 278 which is up from 262 back in January,  NECK:  No bruits.  LUNGS:  Clear to auscultation.  CARDIAC:  Regular rate and rhythm, S1-S2, no S3, no murmurs.  ABDOMEN:  Obese, benign.  EXTREMITIES:  No edema, 2+ pulses.   A 12 lead EKG normal sinus rhythm, 60 beats per minute.   IMPRESSION:  1. Coronary artery disease, catheterization back in April of 2007.      Left anterior descending was irregular, 30% proximal.  D1 was a      very small vessel, had an 80% proximal and mid-narrowing.  There      was a 40% - 50% narrowing in the mid left anterior descending, 70%      ostial D2, 40% distal left anterior  descending. Circumflex was      occluded after a small diagonal, there was retrograde filling of      the large marginal branch.  This underwent percutaneous      transluminal coronary angioplasty stent from 100% to 0; there was a      second stenosis of about 80% after a small sub-branch, this also      was taken down to 0; 90% stenosis distally, went down to 0 with      TIMI 3 flow.  The right coronary artery was moderate in size, 50% -      60% stenosis.  Right ventricular branch had an 80% ostial lesion      distal vessel, minor irregularities.  Left ventricular ejection      fraction 45% at the time.  I do not know if his shortness  of breath      is deconditioning or worsening coronary artery disease.  He plans      to enter an exercise program, would recommend a stress Myoview      prior, hold beta blocker the day of the procedure.  2. Dyslipidemia, for now we would keep on same regimen and again he is      going to try to get back into weight loss and this should help.  3. Healthcare maintenance scan, encouraged him to increase his      exercise, get his weight down.  4. Question sleep apnea.  Patient had a sleep study done in [facility]      he thinks, he is going to reflect on this.  He said it was      reportedly negative, he also had a hard time with the mask.  Would      not schedule a sleep study for now but again need to follow      closely.  5. I will tentatively set to see the patient back in 6 months, sooner      if problems develop.  Continue on current regimen.  Will see if he      can be a candidate for continued cardiac rehab.     Pricilla Riffle, MD, [facility]  Electronically Signed    PVR/MedQ  DD: 12/03/2006  DT: 12/03/2006  Job #: [id number]   cc:   Geoffry Paradise, M.D.

## 2010-08-05 NOTE — Assessment & Plan Note (Signed)
[place] [facility]                              CARDIOLOGY OFFICE NOTE   DELOS, KLICH                       MRN:          [id number]  DATE:10/27/2005                            DOB:          September 20, 1955    IDENTIFICATION:  [name] is a 55 year old gentleman status post MI back in  April (lateral).  I last saw him back in May.   In the interval, he has done well.  He has finished the initial rehab  portion at [facility] and did very well with this and has  actually enrolled in their continuing training program.  Note, his weight is  down actually about 10 pounds from previous.  He denies chest pain.  Breathing is okay.  Denies dizziness.   CURRENT MEDICATIONS:  1. Vytorin 10/40 every day.  2. Coreg 6.25 b.i.d.  3. Aspirin 325 every day.  4. Plavix 75 every day.  5. Synthroid 0.112 every other day alternating with 0.15 every other day.  6. Keppra 750 b.i.d.   PHYSICAL EXAMINATION:  GENERAL:  The patient is in no distress.  VITAL SIGNS:  Blood pressure 102/74, pulse is 68, weight 250 down from 261.  NECK:  No bruits.  LUNGS:  Clear.  CARDIAC:  Regular rate and rhythm.  S1 S2.  No S3.  No significant murmurs.  ABDOMEN:  Benign.  EXTREMITIES:  No edema.   Cardiac catheterization, back in April, showed LAD was irregular, had a 30%  narrowing proximally, first diagonal was small with an 80% proximal lesion.  There was a 40-50% mid LAD lesion.  Second diagonal had a 70% ostial lesion.  The circumflex was occluded after the first marginal and underwent non-drug-  eluting stent placement after angioplasty x2.  There was also a 90% stenosis  in a large branch distally, this had angioplasty.  The right coronary artery  was moderate in size.  PLSA branch is filled via collaterals.  RCA had a 50-  60% stenosis.  RV branch had an 80% ostial narrowing.  LVF at the time was  45%.  Echocardiogram EF of 50%.  A 12-lead EKG:  Normal sinus  rhythm, 68  beats per minute.   IMPRESSION:  1. Coronary artery disease.  With the diffuse nature of his disease, I      would keep him on the Plavix and aspirin as well as his other      medicines.  2. Dyslipidemia.  We will need to get a fasting lipid panel.  He can have      this drawn at Dr. Lanell Matar office.  I have given him a prescription.   To simplify his regimen, I have recommended he try Toprol XL 12.5 then 25  every day.  This will be once a day and generic instead of the Coreg.   Otherwise I will set to see him in November, sooner if problems develop.   ADDENDUM:  Again, I applauded him on his continued enrollment in rehab as  well as weight loss.  Paul Riffle, MD, [facility]    PVR/MedQ  DD:  10/27/2005  DT:  10/27/2005  Job #:  [id number]   cc:   Geoffry Paradise, MD

## 2010-08-05 NOTE — Op Note (Signed)
NAME:  [name], [name]                          ACCOUNT NO.:  [id number]   MEDICAL RECORD NO.:  [id number]                   PATIENT TYPE:  AMB   LOCATION:  [facility]                                 FACILITY[facility]  [facility]   PHYSICIAN:  Boston Service, M.D.             DATE OF BIRTH:  [birth year]   DATE OF PROCEDURE:  10/06/2003  DATE OF DISCHARGE:                                 OPERATIVE REPORT   INTERNIST:  Geoffry Paradise, M.D.   UROLOGIST:  Boston Service, M.D.   PREOPERATIVE DIAGNOSIS:  Intractable right costovertebral angle tenderness  due to a 3 mm calculus in the right distal ureter.  Has been present now for  almost 10 days.  See office notes from June 10, 2003, September 29, 2003, October 01, 2003, as well as CT scan from October 03, 2003.   POSTOPERATIVE DIAGNOSIS:  Same.   PROCEDURE:  Cystoscopy, retrograde ureteroscopy with stone manipulation.   ANESTHESIA:  General.   DRAINS:  None.   COMPLICATIONS:  None.   DESCRIPTION OF PROCEDURE:  Patient was prepped and draped in the dorsal  lithotomy position after institution of an adequate level of general  anesthesia.  A well-lubricated 21 French panendoscope was gently inserted at  the urethral meatus.  Normal urethra and sphincter.  Intense bullous edema  in the region of the right ureteral orifice.  Normal configuration at the  left orifice.  Right and left retrogrades were performed.  Normal course and  caliber of the left ureter, pelvis, and calyces with prompt drainage at 3-5  minutes.  The right ureter gave the appearance of a retrocaval ureter with  proximal deviation and a configuration of the number seven.  There was  proximal hydronephrosis with a 3-4 mm filling defect in the distal ureter.  Passage of a guidewire beyond the stone resulted in prompt efflux of  concentrated urine from the right ureteral orifice.  Once this subsided in  about 10-12 minutes, the ureteroscope was inserted along side the guidewire.  The stone  was easily localized, negotiated into the basket, and then  withdrawn.  The ureteroscope was reinserted.  Bullous edema of the distal  ureter but no evidence of  perforation or retained stony fragments.  The scope was advanced to the  limit of the short 6 [place] scope.  No other intraureteral pathology was  identified.  The ureteroscope was withdrawn.  The bladder was drained.  The  patient was given a B&O suppository and returned to recovery in satisfactory  condition.                                               Boston Service, M.D.    RH/MEDQ  D:  10/06/2003  T:  10/06/2003  Job:  [id number]   cc:  Geoffry Paradise, M.D.  [address]  [place]  [place] [address]  Fax: [phone]

## 2010-08-05 NOTE — Op Note (Signed)
[place]  Patient:    [name], [name] Visit Number: [id number] MRN: [id number]          Service Type: DSU Location: [facility] Attending Physician:  Marlowe Kays Page Dictated by:   Illene Labrador. Aplington, M.D. Proc. Date: 07/08/01 Admit Date:  07/08/2001 Discharge Date: 07/08/2001                             Operative Report  PREOPERATIVE DIAGNOSIS:  Chronic impingement syndrome with partial rotator cuff tear, right shoulder.  POSTOPERATIVE DIAGNOSIS:  Chronic impingement syndrome with partial rotator cuff tear, right shoulder.  PROCEDURES: 1. Right shoulder arthroscopy (normal examination). 2. Arthroscopic subacromial decompression, right shoulder.  SURGEON:  Illene Labrador. Aplington, M.D.  ASSISTANT:  Marcie Bal. Troncale, P.A.C.  ANESTHESIA:  General.  PATHOLOGY AND JUSTIFICATION FOR PROCEDURE:  He injured his shoulder in December of last year, has had persistent, progressive pain in the shoulder radiating down toward the elbow.  He has had minimal tenderness at the [facility] joint with fairly normal-looking x-rays on plain films.  MRI has demonstrated significant rotator cuff tendinopathy without evidence of a full-thickness tear.  The glenohumeral joint appeared normal.  Accordingly, he is here today for the above-mentioned surgical procedure.  DESCRIPTION OF PROCEDURE:  Satisfactory general anesthesia.  Semisitting position in the beach chair position on the Schlein frame.  The right shoulder was prepped with Duraprep, draped in a sterile field.  The anatomy of the shoulder was marked out, and a lateral portal, posterior soft spot portal, and subcoronal space were all infiltrated with 0.5% Marcaine with adrenalin. Through a small stab wound and using a blunt trocar, I atraumatically entered the glenohumeral joint, which was normal on evaluation.  Representative pictures were taken.  I then redirected the scope to the subacromial area and through  the lateral portal introduced a 4.2 shaver, where I performed a bursectomy.  There was not a very active bursitis present.  Very prominent, jagged-looking subacromial spurs were noted and pictured.  I then introduced the Arthrocare vaporizer and began removing soft tissue from the underneath surface of the acromion as well as around the perimeter and followed this with a 4.0 oval bur, where I began burring down the underneath surface of the acromion.  I then rotated back and forth between the bur, the Arthrocare vaporizer, and the 4.2 shaver until all soft tissue and impingement problems had been corrected.  The rotator cuff was also gently smoothed down with the shaver.  I then took final pictures with the arm to the side and the arm abducted, showing wide clearance between the subacromial space and the rotator cuff.  Also as part of the decompression I took away part of the underneath surface of the distal clavicle.  The shoulder was then evacuated of all fluid and the two portals closed using 4-0 nylon.  These portals were once again reinjected with Marcaine with adrenalin, as was the subacromial space. Betadine, Adaptic, and a dry sterile dressing were applied, followed by a large sling.  He tolerated the procedure well and at the time of this dictation was on the way to recovery in satisfactory condition with no known complications. Dictated by:   Illene Labrador. Aplington, M.D. Attending Physician:  Joaquin Courts DD:  07/08/01 TD:  07/08/01 Job: [id number]

## 2010-08-05 NOTE — Cardiovascular Report (Signed)
[name], [name]                ACCOUNT NO.:  [id number]   MEDICAL RECORD NO.:  [id number]          PATIENT TYPE:  INP   LOCATION:  2807                         FACILITY:  [facility]   PHYSICIAN:  Arturo Morton. Riley Kill, M.D. [facility] OF BIRTH:  03-31-1955   DATE OF PROCEDURE:  06/22/2005  DATE OF DISCHARGE:                              CARDIAC CATHETERIZATION   INDICATIONS:  Mr. Hilburn is a 55 year old gentleman who is the son of one of  my patients.  The patient presents to the emergency room with onset of chest  pain.  Electrocardiogram subsequently demonstrated acute myocardial  infarction.  He was subsequently seen by Dr. Tenny Craw and brought promptly to  the cardiac catheterization laboratory.  I-STAT was done to check  creatinine, and hematocrit was checked also with the i-STAT.  We explained  the procedure to the patient, quickly examined him.  Urgent catheterization  was recommended.   PROCEDURE:  1.  Left heart catheterization.  2.  Selective coronary arteriography.  3.  Selective left ventriculography.  4.  PTCA and stenting x3 of the circumflex coronary artery with nonDES      vision stents.  5.  Angio-Seal closure of the femoral artery.   DESCRIPTION OF PROCEDURE:  The patient was brought to the catheterization  laboratory and prepped and draped in the usual fashion.  Through an anterior  puncture, the right femoral artery was easily entered.  Following this,  views of the right and left coronary arteries were obtained in multiple  angiographic projections.  The patient had evidence of a total occlusion of  the circumflex coronary artery.  There is also evidence of what appeared to  be possibly a chronic occlusion of one of the marginal branches.  There was  no proximal stump noted.  The patient had been given heparin, with an  appropriate ACT and the administration of double bolus Integrilin.  A  Prowater wire was placed down the vessel, and we were able to establish  reperfusion into the large second marginal system.  With this, balloon  dilatation was done with a 2.25 x 20-mm balloon.  The balloon was taken up  and reperfusion established.  There was a first marginal branch that was  very large and filling by retrograde collaterals; however, there was no  proximal stump.  We probed the area for approximately 10 minutes with a  Prowater wire to see if we could gain access to any type of side branch, but  this was not possible given the resolution of ST-segment changes, resolution  of chest pain, and lack of appropriate stump and filling of the collaterals  promptly, it was felt that this likely represented a chronic occlusion.  We  then subsequently placed a 23 x 2.75 Vision stent.  We elected not to use a  drug-eluting stent initially, subsequent to that, we also felt that given  the long area of treatment that it would probably be optimal not to do this.  There were 2 additional lesions which were dilated with a balloon  subsequently.  The stent was deployed at about 15 atmospheres,  and  postdilated using a 3.25 Quantum Maverick balloon.  The 2 distal lesions  were then dilated with both a 2.5 and 2.75 mm balloons, but there was a fair  amount of elastic recoil and suboptimal results.  We therefore placed a 28 x  2.75 Vision stent distally.  The intermediate area was covered with an 18 x  2.75 stent and was telescoped to the distal end of the proximal stent and  telescoped into the proximal end of the distal stent.  This was taken up to  also 16 atmospheres.  Postdilatation was then done through the entire area.  There was marked improvement in the appearance of the artery.  ACTs were  checked throughout the course of the procedure.  All catheters were then  subsequently removed.  Central aortic and left ventricular pressures were  measured with the pigtail.  Ventriculography was done in the RAO projection.  The patient had resolution of ST-segment  elevation and also had resolution  of chest pain.   HEMODYNAMIC DATA:  1.  Central aortic pressure 169/108 with a mean of 134.  2.  Left ventricular pressure 141/19.  3.  No gradient pullback across the aortic valve.   ANGIOGRAPHIC DATA:  1.  Ventriculography was done in the RAO projection.  Estimated ejection      fraction to be in the range of 45%.  There did not appear to be      significant mitral regurgitation.  2.  The right coronary was a moderate size vessel providing the posterior      descending and posterolateral branch.  This then filled by collaterals      into the AV circumflex which then filled downward into the distal      portion of the circumflex which was occluded.  The right coronary artery      a 50-60% area of mid stenosis.  There was an RV branch that also had 80%      ostial narrowing.  The distal vessel had minor luminal irregularities      but was without critical disease.  3.  The LAD was an irregular vessel.  There was 30% narrowing in the      proximal vessel overlying the first diagonal.  The first diagonal was a      very small vessel with 80% proximal and mid narrowing.  In the mid      vessel, there was a 40-50% area of eccentric narrowing with a 70% ostial      narrowing of the second diagonal.  There was a 40% narrowing of the LAD      distally.  4.  The circumflex was totally occluded after a small first marginal.  There      was retrograde filling of the large marginal branch but no proximal      identification of its takeoff.  Following reperfusion, the 100% stenosis      was reduced to 0%.  There was a second stenosis of about 80% just after      the small subbranch.  This was taken to 0% as well.  There was a 90%      stenosis in the large branch distally, and this was taken to 0% as well.      TIMI III flow was established after TIMI zero flow.  There was      resolution of ST segments and chest pain with restoration of flow.  CONCLUSIONS:   1.  Acute lateral  wall myocardial infarction due to total occlusion of the      circumflex leading into the second obtuse marginal with what appears to      be chronic total occlusion of the first marginal with retrograde      collateralization probably from the left anterior descending.  2.  Scattered disease of the left anterior descending of mild degree with      significant disease involving small diagonal branches.  3.  Moderate stenosis of the mid right coronary artery with      collateralization of the arteriovenous circumflex prior to reperfusion.   DISPOSITION:  The patient has evidence of diffuse disease.  It is not  critical.  Medical therapy will be recommended at the present time.  We  chose non-drug-eluting platform as we placed almost 70 mm of stent in  overlapping fashion.  Given the acute nature of the presentation, it was  felt that this was the optimal approach at the present time.  We will follow  the patient closely.  He may eventually need subsequent treatment.   ADDENDUM:  The patient was prepped and draped, and the gloves were  exchanged.  He was closed with an Angio-Seal device with excellent  hemostasis.      Arturo Morton. Riley Kill, M.D. [facility]  Electronically Signed     TDS/MEDQ  D:  06/22/2005  T:  06/23/2005  Job:  [id number]   cc:   Geoffry Paradise, M.D.  Fax: [phone]   Pricilla Riffle, M.D.  [address]  [address]  [place]  [place] [address]   CV Lab

## 2010-08-05 NOTE — Discharge Summary (Signed)
[name], RADIN NO.:  [id number]   MEDICAL RECORD NO.:  [id number]          PATIENT TYPE:  INP   LOCATION:  3733                         FACILITY:  [facility]   PHYSICIAN:  Pricilla Riffle, M.D.    DATE OF BIRTH:  1956/01/07   DATE OF ADMISSION:  06/22/2005  DATE OF DISCHARGE:  06/26/2005                                 DISCHARGE SUMMARY   PRIMARY CARDIOLOGIST:  Dr. Dietrich Pates   PRIMARY CARE PHYSICIAN:  Dr. Geoffry Paradise   NEUROLOGIST:  Dr. Ron Parker at [facility]   DISCHARGING PHYSICIAN:  Dr. Dietrich Pates   DISCHARGE DIAGNOSES:  1.  Coronary artery disease status post ST elevated myocardial infarction.      1.  Status post cardiac catheterization/PTCA.  Patient had placement of          x3 bare metal stents to the circumflex artery on June 22, 2005 by          Dr. Bonnee Quin.  Patient had total occlusion of the circumflex          leading into the second obtuse marginal with what appeared to be          chronic total occlusion of the first marginal with retrograde          collateralization probably from the left anterior descending artery.          Patient also found to have moderate stenosis of the mid right          coronary artery with collateralization of the arteriovenous          circumflex prior to reperfusion.  Patient has diffuse disease that          is non-critical at this time.      2.  Status post echocardiogram showing an ejection fraction of 50%.  2.  Hypotension with Toprol.  Toprol discontinued.  Patient tolerating a low      dose of Coreg at this time.  3.  Hypothyroidism.  4.  History of seizure disorder secondary to cerebral vasculitis followed by      Dr. Ron Parker at [facility].  5.  Hyperlipidemia/elevated LDL.  Triglycerides 179, LDL 118.  Lipitor 80 mg      initiated.  6.  Obesity/sedentary lifestyle.  7.  Baseline hemoglobin A1c of 6.5.  Will need to recheck in future.  8.  Strong family history of coronary  artery disease.   HOSPITAL COURSE:  [name] is a very pleasant 55 year old Caucasian male  with no known history of coronary artery disease, however, with strong  family history of multiple risk factors.  He presented to the [facility]  Emergency Department with complaints of chest pain.  EKG found patient to be  having an inferior segment ST elevated MI.  Patient was taken emergently to  the cardiac catheterization laboratory for intervention to be performed by  Dr. Riley Kill.  He was placed on aspirin, Plavix, beta blocker, Statin therapy  as tolerated.  Cardiac catheterization results as stated above.  Patient  tolerated procedure without complications.  Patient stable.  Transferred to  telemetry unit.  2-D echocardiogram performed on June 23, 2005.  Left  ventricular ejection fraction estimated at 50%.  The study was inadequate  for the evaluation of left ventricular region wall motion.  There was mild  mitral anular calcification.  There is hypokinesis of the inferior/posterior  wall.  The patient with episodes of dizziness and lightheadedness secondary  to initiation of Toprol.  Toprol discontinued.  Patient started on low dose  Coreg which the patient seems to be tolerating.  At this time we will hold  off on an ACE inhibitor and reevaluate in office setting if blood pressure  tolerates.  Cardiac rehabilitation in to educate patient.  Patient  ambulating in hallway without difficulties, denying any chest pain or  shortness of breath.  Dr. Dietrich Pates in to see patient on day of discharge.  Patient afebrile.  Telemetry showing sinus rhythm in the 70s.  Blood  pressure 120/75.  Prior to discharge blood work:  Chemistries 137, potassium  3.9, glucose 121, BUN 13, creatinine 1.3.  CBC:  White blood cell count 8.6,  hemoglobin 15.3, hematocrit 43.2 with a platelet count of 170,000.  Hemoglobin A1c 6.3.  Lipid profile:  Total cholesterol 190, triglycerides  179, HDL 36, LDL 118.  TSH 3.199.   Cardiac enzymes:  Troponin peaked at  67.08.  No laboratory work pending at time of discharge.   DISPOSITION:  Patient being discharged home.  He will follow up with Dr.  Tenny Craw' _____ on April 16 at 11:30.  He will need blood work done, Building services engineer May 7 at 11 a.m.  Patient is instructed to remain n.p.o. for eight  hours prior to blood work on May 7.  Patient states he has a follow-up  appointment with Dr. Jacky Kindle the first week in May for a yearly physical  examination.  I instructed patient to keep this appointment.  Follow up with  Dr. Ron Parker as needed or already scheduled.   RESTRICTIONS AT TIME OF DISCHARGE:  Patient is instructed to follow a low  fat, low salt, low sugar diet.  Increase activity slowly.  He may shower.  No tub bathing x2 days.  No sexual activity for one week.  No driving for  one week.  No lifting over 10 pounds x1 week.  He has also been given a post  cardiac catheterization discharge instructions and the post myocardial  infarction contract.   PAIN MANAGEMENT:  Nitroglycerin as needed.   MEDICATIONS:  1.  Plavix 75 mg daily at least 30 days, longer if instructed by Dr.      Riley Kill.  2.  Aspirin 325 mg daily.  3.  Lipitor 80 mg daily.  4.  Coreg 3.125 mg p.o. b.i.d.  5.  Synthroid 125 mcg daily.  6.  Keppra 750 mg p.o. b.i.d. or as previously prescribed.   DURATION OF DISCHARGE ENCOUNTER:  45 minutes.      Dorian Pod, NP    ______________________________  Pricilla Riffle, M.D.    MB/MEDQ  D:  06/26/2005  T:  06/26/2005  Job:  [id number]   cc:   Geoffry Paradise, M.D.  Fax: [phone]   [place], M.D.

## 2010-08-05 NOTE — Assessment & Plan Note (Signed)
[facility]                            CARDIOLOGY OFFICE NOTE   DESTIN, VINSANT                       MRN:          [id number]  DATE:03/23/2006                            DOB:          11/04/1955    IDENTIFICATION:  [name] is a 55 year old gentleman who I last saw back  in August. He has a history of an MI in April, 2007.   Since seen he has been doing well from a cardiac stand-point. He denies  chest pain, bilateral significant shortness of breath, his weight is up  though. He is active however, walking on his job.   He notes occasional left-sided headaches, temporal, also has some  tingling in the left side of his face on and off.   CURRENT MEDICATIONS:  1. Keppra 750 b.i.d.  2. Synthroid 0.112 every other day alternating with 0.15 other days.  3. Plavix 75 daily.  4. Aspirin 325 daily.  5. Vytorin 10/40 daily.  6. Toprol XL 12.5 daily.   PHYSICAL EXAMINATION:  GENERAL: The patient is in no distress.  VITAL SIGNS: Blood pressure 114/63, pulse 63, weight 262 up from 150  last visit.  NECK: No bruits.  HEENT: Normocephalic, atraumatic. EOMI. PERRL.  NEURO: Cranial nerves II-XII intact.  LUNGS: Clear.  CARDIAC EXAM: Regular rate and rhythm S1, S2. No S3, no murmurs.  ABDOMEN: Obese, benign.  EXTREMITIES: No edema.   IMPRESSION:  1. Coronary artery disease.  Cardiac catheterization in April showed      irregularities of the LAD diagonal with smaller than 8% proximal      lesion. There was a 40-50% mid LAD. Second diagonal had a 70%      ostial lesion. The circumflex was occluded after the first marginal      had a non drug eluting stent placed there. There was a 90% stenosis      in a large branch distally that had angioplasty done. The RCA was      moderate in size, POSA fills via collaterals. RCA had a 50-60%      stenosis, RV branch had an 80% ostial narrowing, LVF was 45%, by      echo, 50%.   Clinically doing well. I would  keep him on the current regimen.   1. Dyslipidemia. I have signed for fasting lipid panel to be done at      Dr. Lanell Matar office, he will have it faxed here.  2. Headaches, mouth tingling. I will set him up for carotid Doppler's,      nothing beyond that for now.   Tentatively set followup for August, sooner if problems develop.     Pricilla Riffle, MD, [facility]  Electronically Signed    PVR/MedQ  DD: 03/23/2006  DT: 03/23/2006  Job #: [id number]   cc:   Geoffry Paradise, M.D.

## 2010-08-05 NOTE — H&P (Signed)
[name], [name] NO.:  [id number]   MEDICAL RECORD NO.:  [id number]          PATIENT TYPE:  EMS   LOCATION:  MAJO                         FACILITY:  [facility]   PHYSICIAN:  Pricilla Riffle, M.D.    DATE OF BIRTH:  1955-09-07   DATE OF ADMISSION:  06/22/2005  DATE OF DISCHARGE:                                HISTORY & PHYSICAL   PRIMARY CARE PHYSICIAN:  Geoffry Paradise, M.D.   NEUROLOGIST:  Dr. Ron Parker at [facility].   PRIMARY CARDIOLOGIST:  The patient is new to [facility] (Dr. Dietrich Pates).   PATIENT PROFILE:  A 55 year old white male with no prior history of CAD who  presents to the ED with chest pain and inferior ST segment elevation.   PROBLEMS:  1.  Inferior ST segment elevation MI.  2.  Obesity.  3.  Hypothyroidism.  4.  History of seizure disorder secondary to cerebral vasculitis.   HISTORY OF PRESENT ILLNESS:  A 55 year old white male with no prior history  of CAD.  He reports a couple of weeks ago [place] airport and was  hurrying and developed some substernal chest discomfort which resolved with  rest.  He did not think anything of it.  Today while at work, he was sitting  at his desk just doing some work and developed a 6/10 mid scapular and  anterior chest pressure without associated symptoms, lasting about 30  minutes and resolving spontaneously.  At about 3:30 p.m., he had recurrent  symptoms and went home.  He called Dr. Jacky Kindle and was advised to present to  the [facility] ED.  In the ED, ECG revealed inferior ST segment elevation  with reciprocal changes anteriorly as well as intermittent ST elevation of  V5-6.  We were called for a code __________.  The patient is currently  complaining of 2/10 chest pain.  He is given a 5000 units heparin bolus as  well as initiated on 5 mcg of IV nitroglycerin and given 300 mg of p.o.  Plavix.  The cardiac cath lab was activated, and he is taken to the cath lab  emergently.   ALLERGIES:  No known drug allergies.   HOME MEDICATIONS:  Synthroid and Keppra.   FAMILY HISTORY:  Father has a history of CAD, MI, and CABG.  Mother died of  a heart attack at age 50.   SOCIAL HISTORY:  Lives in [place] with his new wife.  He works in  Psychologist, clinical.  He has never smoked.  Rarely uses alcohol.  Does not routinely  exercise.   REVIEW OF SYSTEMS:  Positive for a history of migraines, seizure disorder,  and chest pain, as outlined in the HPI.  All other systems are reviewed and  are negative.   PHYSICAL EXAMINATION:  VITAL SIGNS:  Blood pressure 139/101, heart rate 71,  respirations 18, pulse ox 100% on 2 liters.  GENERAL:  A pleasant white male in no acute distress.  Awake, alert and  oriented x3.  NECK:  Normal carotid upstrokes.  No bruits.  No JVD.  LUNGS:  Respirations are regular and unlabored.  Clear to auscultation.  CARDIOVASCULAR:  Regular S1 and S2.  No S3, S4, or murmurs.  ABDOMEN:  Round, soft, nontender, nondistended.  Bowel sounds are present  x4.  EXTREMITIES:  Warm, dry, and pink.  No clubbing, cyanosis or edema.  Dorsalis pedis and posterior tibial pulses are 2+ and equal bilaterally.   ACCESSORY CLINICAL FINDINGS:  Chest x-ray and labs are pending.   ECG shows sinus rhythm with 1 mm inferior ST segment elevation and 2-3 mm  anterior ST depression with 1 mm ST elevation in V5-6.   ASSESSMENT/PLAN:  1.  Acute ST elevation myocardial infarction:  The patient is being taken to      the cardiac cath lab for emergent cardiac catheterization to be      performed by Dr. Riley Kill.  Plan on initiating aspirin, Plavix, beta      blocker, statin therapy.  Potentially ACE inhibitor depending on EF.  2.  Lipid status, currently unknown.  Check fasting lipids and LFTs.  Add      statin therapy.  3.  Hypertension:  Not previously diagnosed.  Add a beta blocker and      potential ACE inhibitor.  4.  Seizure disorder:  Continue Keppra.  5.   Hypothyroidism:  Continue Synthroid.      Ok Anis, NP    ______________________________  Pricilla Riffle, M.D.    CRB/MEDQ  D:  06/22/2005  T:  06/22/2005  Job:  [id number]

## 2010-08-05 NOTE — Consult Note (Signed)
[facility]  Patient:    [name], [name]                       MRN: [id number] Proc. Date: 12/16/99 Adm. Date:  [id number] Disc. Date: [id number] Attending:  Devoria Albe CC:         Richard A. Jacky Kindle, M.D.  Dr. Henderson Newcomer, Division of Immunology, [facility]   Consultation Report  DATE OF BIRTH:  [birth year]  CHIEF COMPLAINT:  New onset seizures.  HISTORY OF PRESENT ILLNESS:  Mr. 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  He was transported to [facility] Emergency Room where he was seen by the emergency physician and I was asked to consult to determine the etiology of his dysfunction and make recommendations for further work-up and treatment.  The patient has had a series of three migraine headaches in September.  He had two within the previous year and a half.  These headaches involved a crescentic obscuration of vision that was not opaque and static in its location.  This came on 15 minutes to half an hour before onset of severe frontal pain that was retro-orbital, associated with nausea and photophobia and incapacitation.  The patient is able to take over-the-counter medications such as aspirin, Aleve or Tylenol in large doses.  If taken at the time of the aura, he does not get a headache.  If the headache is allowed to occur, he will have a headache for several hours and gradually the pain will subside. The patient has had episodes over the past two days of migrainous symptoms. Today he felt well in the evening and was working in the concession stand when all of a sudden the room began to whirl and he fell having a generalized tonic clonic seizure, lacerating his tongue.  He did not have urinary incontinence.  PAST MEDICAL HISTORY:  Past medical history is quite complicated.  The patient presented  initially June 24, 1998 with a week long history of headache that persisted in the right temporal, not associated with fevers, chills or neck stiffness.  Headache intensified over several hours and the patient became confused and disoriented.  Lumbar puncture performed showed an opening pressure of 230 mm of water. Spinal fluid had 117 white cells, 2 red blood cells, 88 lymphs, glucose of 57, protein of 118, cryptococcal antigen was negative.  The patient had an MRI scan of the brain which showed some subtle T2 signal intensity in the posterior fossa.  The patient had definite improvement in his headaches and mental status but did not fully recover.  Titers for [facility] spotted fever were negative.  Herpes simplex virus PCR was negative, HIV negative.  Thyroid functions were normal.  RPR was nonreactive.  The patient went home after seven days and did well until Jul 20, 1998.  The patient had some headaches and was seen by Dr. Anne Hahn in mid April with normal eye examination.  Reexamination on Jul 19, 1998 showed evidence of papilledema. The patient had an MRI scan of the brain which showed diffuse cortical edema, most prominent in the left frontal and parieto-occiptal regions, slow mentation and headaches.  Lumbar puncture showed xanthochromic fluid with opening pressure of 440 mm of water.  MRI scan failed to show evidence of venous sinus thrombosis.  The patient was admitted to the hospital  and had an extensive reevaluation. CSF showed 130 white blood cells, predominant lymphocytes, slight elevation of protein and glucose and mental status improved.  The patient was treated with acyclovir until herpes PCR was normal.  The patient had HIV, ANA, rheumatoid arthritis testing, all which was negative.  Glucose was 61, protein 144, 40 white blood cells, 86 lymphs, 1 eosinophil, 13 monocytes, 215 red blood cells. The patient also had cultures for bacteria, TB and fungus.  Antigens  were sent for cat scratch fever, herpes 6, IgG and IgM, Q-fever mycoplasma pneumoniae IgM, CMV, TB, AB virus, DNA by PCR, herpes 1 and 2 antibody IgG, [facility] spotted fever IgG and IgM.  Serologies also sent for TOGA, FLAVA, BUNYA and enteroviruses.  The patient was brought back for serum for Whipple disease by PCR.  He was treated with Diamox, Tylox and Tylenol.  The patient was readmitted again on May 9-10, 2000.  At that time, he had worsening mental status, increasing headache, nausea and vomiting.  Lumbar showed xanthochromia in about 150 mm of water.  CSF studies:  Glucose 64, protein 840, 690 white blood cells, 83 lymphs, 12 monos, 4 eosinophils. Culture grew a heavy growth of Cryptococcus.   The patient was placed on amphotericin and was sent to [facility] to be seen by Dr. Henderson Newcomer.  The patient had nonfocal, non-lateralized examination and papilledema at the time of transfer.  At that point, the patient underwent an extensive evaluation.  For some reason, the family ultimately was left to believe that the patient did not have cryptococcal meningitis and he ultimately underwent a biopsy in the left frontal area.  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  I do not know if this represents granulomatous angiitis or some other identified vasculitis.  The family has been told that while there is high probability of this, physicians are not certain.  The patient was treated with high dose prednisone and Cytoxan.  The patient gained about 65 pounds.  Gradually prednisone was tapered and the patient remained on Cytoxan.  He has done very well to date and underwent initial taper from his Cytoxan in May from 150 down to 125.  In September at Labor Day he went down to 100.  Tomorrow he was to go down to 50 and he was to come off medication after two more weeks.  EEG carried out during his initial hospitalization  showed diffuse swelling  without evidence of seizure activity.  The patient had a 5 Hz background. Toward the end of the recording, there was a 4 second burst of what appeared to be spike and flow of activity.  I could not determine if this was artifact or a true cortical disturbance.  The patient was not placed on any epileptic medication.  CURRENT MEDICATIONS:  Cytoxan 100 mg a day, Prilosec 20 mg a day, Os-Cal 500 mg twice a day.  ALLERGIES:  No known drug allergies.  FAMILY HISTORY:  Noncontributory for this.  The patients father has atherosclerotic cardiac disease.  The patient has one sister and two brothers who are alive and well.  SOCIAL HISTORY:  The patient does not use tobacco or alcohol.  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 The patient is divorced and lives with his son.  PHYSICAL EXAMINATION:  GENERAL:  On examination tonight, this  is a pleasant, well-developed, right-handed gentleman in no distress.  VITAL SIGNS:  Blood pressure 150/80, resting pulse 84, respirations 20, capillary glucose of 118.  HEENT:  No signs of infection.  He has lacerated the left side of tongue, a small are in the left buccal mucosa.  NECK:  Supple, full range of motion.  LUNGS:  Clear to auscultation.  CARDIOVASCULAR:  Heart no murmurs, pulses normal.  ABDOMEN:  Soft, nontender, bowel sounds normal.  EXTREMITIES:  Well-formed without edema, cyanosis, alterations in tone or tight heel cords.  SKIN:  He had no lesions on his head, no neurocutaneous lesions.  NEUROLOGIC EXAMINATION:  MENTAL STATUS:  The patient was awake, alert, attentive appropriate, no dysphasia or dyspraxia.  His memory was good.  He was able to give me much of his history.  He does not have a lot of memory for the June 2000 time.  CRANIAL NERVE EXAMINATION:  Round, reactive pupils.  He  has sharp disc margins.  I did not see venous pulsations.  Visual fields full to double simultaneous stimuli.  Extraocular movements full and conjugate.  Okay in response and equal bilaterally.  Symmetric facial strength and sensation.  Air conduction greater than bone conduction bilaterally.  MOTOR EXAMINATION:  Normal strength, tone and mass.  Good fine motor movement, no pronator drift.  Sensation intact to cold, vibration, stereoagnosis.  CEREBELLAR EXAMINATION:  Good finger-to-nose, rapid repetitive movements, no tremor, dystaxia, dysmetria.  Gait and station was normal.  The patient is able to get up on his toes and heels and perform a tandem without falling.  DEEP TENDON REFLEXES:  Symmetric and diminished.  The patient had diminished flexor plantar responses.  IMPRESSION: 1. New onset seizure, unknown etiology, not definitely epilepsy (780.39). 2. History of central nervous system vasculitis. 3. History of cryptococcal meningitis.  PLAN:  The patient will continue on his Cytoxan without taper.  We will not place him on any epileptic medication tonight.  Should he have further seizures, we will admit him to the hospital.  If he does not have further seizures, he will have a EEG and an MRI scan without and with contrast next week.  We have performed a CT scan tonight which shows an area of encephalomalacia in the right frontal region with an area in the deep white matter in a radial line from the encephalomalacia that suggests some white matter disease as well.  If no other etiology was known about this area, we would suspect there had been a vascular event.  The remainder of the brain appears to be normal.  LABORATORY DATA:  Laboratory studies at this time: Hemoglobin 13.7, hematocrit 38.5, MCV 95.5, platelet count 170,000, white count 5,500, 74% polys, 15% lymphs, 6% monocytes, 2% eosinophils, 1% basophil, 2 LUC.  The patient has a basic metabolic panel and alcohol that  are pending.  His EKG showed nonspecific T wave changes.  DISPOSITION:  He is discharged in improved condition.  I have answered the familys questions.  We will see him back in follow-up in one months time, probably with Demetrio Lapping. DD:  12/16/99 TD:  12/18/99 Job: [id number]

## 2010-08-05 NOTE — Letter (Signed)
November 23, 2005      RE:  [name], [name]  MRN:  [id number]  /  DOB:  [birth year]   To Whom It May Concern:   This is a letter regarding [name].  He is a 55 year old gentleman  whom I follow in the cardiology clinic.  He is status post myocardial  infarction back in April of this year.   I have recommended, and he has gone ahead and enrolled in, cardiac  rehabilitation.  This began on Aug 01, 2005.  He had 36 visits.  Overall, he  has done very well with this.  This has gone on at [facility].  His activity has increased.  He has again done this all under a  supervised rehabilitation environment.  I think he has gained tremendously  from the program from a cardiac standpoint.   If you have any questions, please feel free to contact me.  I know he is  going to continue on in the master's level, and again I applauded him on  this.  I think the benefits will continue to grow.    Sincerely,      Pricilla Riffle, MD, [facility]   PVR/MedQ  DD:  11/23/2005  DT:  11/23/2005  Job #:  [id number]

## 2010-10-17 ENCOUNTER — Other Ambulatory Visit: Payer: Self-pay | Admitting: *Deleted

## 2010-10-17 MED ORDER — CLOPIDOGREL BISULFATE 75 MG PO TABS: 75.0000 mg | ORAL_TABLET | Freq: Every day | ORAL | Status: DC

## 2010-12-01 ENCOUNTER — Ambulatory Visit: Payer: Self-pay | Admitting: Internal Medicine

## 2010-12-05 ENCOUNTER — Other Ambulatory Visit: Payer: Self-pay | Admitting: Internal Medicine

## 2010-12-14 ENCOUNTER — Encounter: Payer: Self-pay | Admitting: Internal Medicine

## 2010-12-16 ENCOUNTER — Encounter: Payer: Self-pay | Admitting: Internal Medicine

## 2010-12-16 ENCOUNTER — Ambulatory Visit (INDEPENDENT_AMBULATORY_CARE_PROVIDER_SITE_OTHER): Payer: 59 | Admitting: Internal Medicine

## 2010-12-16 VITALS — BP 125/80 | HR 86 | Ht 71.0 in | Wt 257.0 lb

## 2010-12-16 DIAGNOSIS — E785 Hyperlipidemia, unspecified: Secondary | ICD-10-CM

## 2010-12-16 DIAGNOSIS — E669 Obesity, unspecified: Secondary | ICD-10-CM

## 2010-12-16 DIAGNOSIS — I2510 Atherosclerotic heart disease of native coronary artery without angina pectoris: Secondary | ICD-10-CM

## 2010-12-16 NOTE — Progress Notes (Signed)
HPI [name] is a 55 year old gentleman, who has a history of coronary artery disease, hypothyroidism, dyslipidemia, diabetes, pericardial cyst. I last saw him back in October of 2010. He is status post ST elevation MI in April 2007 has 3 bare-metal stents to the circumflex. Repeat catheterization 2009 showed a 60-65% mid RCA stenosis; small RV branch with 80% stenosis. D2 had a 70% ostial lesion. D1 40%. The stents in the LCX had 50% instent proximally.LVEF 55% overall unchanged. He was in clinic last fall. No chest pains.  NO SOB.  NO tightness.    No Known Allergies  Current Outpatient Prescriptions  Medication Sig Dispense Refill  . aspirin 81 MG tablet Take 81 mg by mouth daily.        . clopidogrel (PLAVIX) 75 MG tablet Take 1 tablet (75 mg total) by mouth daily.  30 tablet  2  . CRESTOR 10 MG tablet take 1 tablet by mouth at bedtime  30 tablet  6  . levETIRAcetam (KEPPRA) 500 MG tablet Take 500 mg by mouth every 12 (twelve) hours.        Marland Kitchen levothyroxine (SYNTHROID, LEVOTHROID) 150 MCG tablet Take 150 mcg by mouth daily.        . metFORMIN (GLUCOPHAGE) 500 MG tablet Take by mouth. 2 tabs twice a day      . metoprolol succinate (TOPROL-XL) 25 MG 24 hr tablet Take 25 mg by mouth daily.          Past Medical History  Diagnosis Date  . Other iatrogenic hypothyroidism   . Hyperlipidemia   . Coronary artery disease   . Diabetes mellitus     No past surgical history on file.  Family History  Problem Relation Age of Onset  . Heart attack Mother     age 55  . Heart attack Father     CABG    History   Social History  . Marital Status: Legally Separated    Spouse Name: N/A    Number of Children: N/A  . Years of Education: N/A   Occupational History  . Not on file.   Social History Main Topics  . Smoking status: Never Smoker   . Smokeless tobacco: Not on file  . Alcohol Use: Yes  . Drug Use:   . Sexually Active:    Other Topics Concern  . Not on file   Social  History Narrative  . No narrative on file    Review of Systems:  All systems reviewed.  They are negative to the above problem except as previously stated.  Vital Signs: BP 125/80  Pulse 86  Ht 5\' 11"  (1.803 m)  Wt 257 lb (116.574 kg)  BMI 35.84 kg/m2  Physical Exam  Patient is in NAD.  HEENT:  Normocephalic, atraumatic. EOMI, PERRLA.  Neck: JVP is normal. No thyromegaly. No bruits.  Lungs: clear to auscultation. No rales no wheezes.  Heart: Regular rate and rhythm. Normal S1, S2. No S3.   No significant murmurs. PMI not displaced.  Abdomen:  Supple, nontender. Normal bowel sounds. No masses. No hepatomegaly.  Extremities:   Good distal pulses throughout. No lower extremity edema.  Musculoskeletal :moving all extremities.  Neuro:   alert and oriented x3.  CN II-XII grossly intact.  EKG:  NSR>     Assessment and Plan:

## 2010-12-20 LAB — COMPREHENSIVE METABOLIC PANEL
AST: 29
Albumin: 3.5
BUN: 7
CO2: 23
Calcium: 8.4
Sodium: 138
Total Bilirubin: 0.6
Total Protein: 6.5

## 2010-12-20 LAB — HEMOGLOBIN A1C
Hgb A1c MFr Bld: 7.1 — ABNORMAL HIGH
Mean Plasma Glucose: 157

## 2010-12-20 LAB — CBC
Hemoglobin: 14.6
MCHC: 33.8
MCHC: 34.2
MCHC: 34.9
MCV: 94.3
MCV: 94.6
Platelets: 129 — ABNORMAL LOW
RBC: 4.5
RBC: 4.76
RDW: 13.2
RDW: 13.4
RDW: 13.6

## 2010-12-20 LAB — GLUCOSE, CAPILLARY
Glucose-Capillary: 104 — ABNORMAL HIGH
Glucose-Capillary: 106 — ABNORMAL HIGH
Glucose-Capillary: 126 — ABNORMAL HIGH

## 2010-12-20 LAB — DIFFERENTIAL
Basophils Absolute: 0
Eosinophils Absolute: 0.2
Eosinophils Relative: 3
Lymphs Abs: 1.6
Monocytes Absolute: 0.4

## 2010-12-20 LAB — CARDIAC PANEL(CRET KIN+CKTOT+MB+TROPI)
CK, MB: 0.9
Relative Index: 0.8
Total CK: 106
Troponin I: 0.01
Troponin I: <0.01

## 2010-12-20 LAB — HEPARIN LEVEL (UNFRACTIONATED)
Heparin Unfractionated: 0.2 — ABNORMAL LOW
Heparin Unfractionated: 0.5
Heparin Unfractionated: <0.1 — ABNORMAL LOW
Heparin Unfractionated: <0.1 — ABNORMAL LOW

## 2010-12-20 LAB — POCT CARDIAC MARKERS: Troponin i, poc: <0.05

## 2010-12-20 LAB — BASIC METABOLIC PANEL
Calcium: 8.8
Creatinine, Ser: 0.93
GFR calc Af Amer: >60
GFR calc non Af Amer: >60
Sodium: 138

## 2010-12-20 LAB — LIPID PANEL: VLDL: 32

## 2010-12-20 LAB — CK TOTAL AND CKMB (NOT AT ARMC)
CK, MB: 1.1
Relative Index: 1.1
Total CK: 101

## 2010-12-20 LAB — PROTIME-INR: Prothrombin Time: 13.3

## 2010-12-20 NOTE — Assessment & Plan Note (Signed)
Needs f/u lipid panel.  Continue statin.

## 2010-12-20 NOTE — Assessment & Plan Note (Signed)
No symptoms of active angina.  Keep on same regimen.

## 2010-12-20 NOTE — Assessment & Plan Note (Signed)
Counselled on wt loss and exercise.

## 2010-12-22 NOTE — Progress Notes (Signed)
Addended by: Burnett Kanaris A on: 12/22/2010 03:49 PM   Modules accepted: Orders

## 2010-12-29 LAB — BASIC METABOLIC PANEL
BUN: 16
Chloride: 106
Creatinine, Ser: 0.97
GFR calc Af Amer: >60
GFR calc non Af Amer: >60
Potassium: 4.1

## 2010-12-29 LAB — CBC
MCV: 91.2
Platelets: 159
RBC: 4.47
WBC: 5.5

## 2010-12-29 LAB — PROTIME-INR: Prothrombin Time: 15

## 2011-01-09 ENCOUNTER — Other Ambulatory Visit: Payer: Self-pay

## 2011-01-09 MED ORDER — CLOPIDOGREL BISULFATE 75 MG PO TABS: 75.0000 mg | ORAL_TABLET | Freq: Every day | ORAL | Status: AC

## 2011-01-09 NOTE — Telephone Encounter (Signed)
.   Requested Prescriptions   Signed Prescriptions Disp Refills  . clopidogrel (PLAVIX) 75 MG tablet 30 tablet 11    Sig: Take 1 tablet (75 mg total) by mouth daily.    Authorizing Provider: Pricilla Riffle    Ordering User: Lacie Scotts   E-scribe to [facility]- [address]

## 2011-07-31 ENCOUNTER — Other Ambulatory Visit: Payer: Self-pay | Admitting: Internal Medicine

## 2011-08-17 ENCOUNTER — Ambulatory Visit: Payer: Self-pay | Admitting: Urology

## 2011-10-05 ENCOUNTER — Encounter: Payer: Self-pay | Admitting: Internal Medicine

## 2011-12-22 ENCOUNTER — Ambulatory Visit (INDEPENDENT_AMBULATORY_CARE_PROVIDER_SITE_OTHER): Payer: 59 | Admitting: Internal Medicine

## 2011-12-22 ENCOUNTER — Encounter: Payer: Self-pay | Admitting: Internal Medicine

## 2011-12-22 VITALS — BP 128/82 | HR 76 | Ht 71.0 in | Wt 244.0 lb

## 2011-12-22 DIAGNOSIS — I2510 Atherosclerotic heart disease of native coronary artery without angina pectoris: Secondary | ICD-10-CM

## 2011-12-22 NOTE — Progress Notes (Signed)
HPI . [name] is a 56 year old gentleman, who has a history of coronary artery disease, hypothyroidism, dyslipidemia, diabetes, pericardial cyst. I last saw him back in October of 2010. He is status post ST elevation MI in April 2007 has 3 bare-metal stents to the circumflex. Repeat catheterization 2009 showed a 60-65% mid RCA stenosis; small RV branch with 80% stenosis. D2 had a 70% ostial lesion. D1 40%. The stents in the LCX had 50% instent proximally.LVEF 55% overall unchanged.  He was in clinic last Sept 2012  Patient denies SOB  No CP  Has R back pain.   Playing golf and bowling Wt down  Watching what eats.  No Known Allergies  Current Outpatient Prescriptions  Medication Sig Dispense Refill  . aspirin 81 MG tablet Take 81 mg by mouth daily.        . clopidogrel (PLAVIX) 75 MG tablet Take 1 tablet (75 mg total) by mouth daily.  30 tablet  11  . CRESTOR 10 MG tablet take 1 tablet by mouth at bedtime  30 tablet  6  . levETIRAcetam (KEPPRA) 500 MG tablet Take 500 mg by mouth every 12 (twelve) hours.        Marland Kitchen levothyroxine (SYNTHROID, LEVOTHROID) 150 MCG tablet Take 150 mcg by mouth daily.        . metFORMIN (GLUCOPHAGE) 500 MG tablet Take by mouth. 2 tabs twice a day      . metoprolol succinate (TOPROL-XL) 25 MG 24 hr tablet Take 25 mg by mouth daily.          Past Medical History  Diagnosis Date  . Other iatrogenic hypothyroidism   . Hyperlipidemia   . Coronary artery disease   . Diabetes mellitus     No past surgical history on file.  Family History  Problem Relation Age of Onset  . Heart attack Mother     age 68  . Heart attack Father     CABG    History   Social History  . Marital Status: Legally Separated    Spouse Name: N/A    Number of Children: N/A  . Years of Education: N/A   Occupational History  . Not on file.   Social History Main Topics  . Smoking status: Never Smoker   . Smokeless tobacco: Not on file  . Alcohol Use: Yes  . Drug Use:   . Sexually  Active:    Other Topics Concern  . Not on file   Social History Narrative  . No narrative on file    Review of Systems:  All systems reviewed.  They are negative to the above problem except as previously stated.  Vital Signs: BP 128/82  Pulse 76  Ht 5\' 11"  (1.803 m)  Wt 244 lb (110.678 kg)  BMI 34.03 kg/m2  Physical Exam Patient is in NAd HEENT:  Normocephalic, atraumatic. EOMI, PERRLA.  Neck: JVP is normal.  No bruits.  Lungs: clear to auscultation. No rales no wheezes.  Heart: Regular rate and rhythm. Normal S1, S2. No S3.   No significant murmurs. PMI not displaced.  Abdomen:  Supple, nontender. Normal bowel sounds. No masses. No hepatomegaly.  Extremities:   Good distal pulses throughout. No lower extremity edema.  Musculoskeletal :moving all extremities.  Neuro:   alert and oriented x3.  CN II-XII grossly intact.  EKG:  76 bpm. Assessment and Plan:  1.  CAD  Patient without symptoms to suggest angina.    2.  HL  Will try to  get fating lipids  3.  HCM  Encouraged exercise.  Congratulated on wt loss.

## 2011-12-22 NOTE — Patient Instructions (Signed)
**Note De-identified Bronnie Vasseur Obfuscation** Your physician recommends that you continue on your current medications as directed. Please refer to the Current Medication list given to you today.  Your physician wants you to follow-up in: 1 year. You will receive a reminder letter in the mail two months in advance. If you don't receive a letter, please call our office to schedule the follow-up appointment.  

## 2012-01-10 ENCOUNTER — Telehealth: Payer: Self-pay | Admitting: *Deleted

## 2012-01-10 NOTE — Telephone Encounter (Signed)
Called and left message with [facility] to fax lab work to [facility].

## 2012-01-19 NOTE — Telephone Encounter (Signed)
Lab work results received from PCP from July and sent to [facility] for review.

## 2012-03-15 ENCOUNTER — Other Ambulatory Visit: Payer: Self-pay | Admitting: Internal Medicine

## 2012-12-05 ENCOUNTER — Other Ambulatory Visit: Payer: Self-pay | Admitting: Internal Medicine

## 2012-12-26 ENCOUNTER — Ambulatory Visit (INDEPENDENT_AMBULATORY_CARE_PROVIDER_SITE_OTHER): Payer: 59 | Admitting: Internal Medicine

## 2012-12-26 ENCOUNTER — Encounter: Payer: Self-pay | Admitting: Internal Medicine

## 2012-12-26 VITALS — BP 114/76 | HR 74 | Ht 71.5 in | Wt 245.0 lb

## 2012-12-26 DIAGNOSIS — I2510 Atherosclerotic heart disease of native coronary artery without angina pectoris: Secondary | ICD-10-CM

## 2012-12-26 NOTE — Progress Notes (Signed)
HPI . [name] is a 57 year old gentleman, who has a history of coronary artery disease, hypothyroidism, dyslipidemia, diabetes, pericardial cyst. I last saw him back in October of 2010. He is status post ST elevation MI in April 2007 has 3 bare-metal stents to the circumflex. Repeat catheterization 2009 showed a 60-65% mid RCA stenosis; small RV branch with 80% stenosis. D2 had a 70% ostial lesion. D1 40%. The stents in the LCX had 50% instent proximally.LVEF 55% overall unchanged.  He was in clinic last Oct 2013  Patient denies SOB  No CP   Playing golf and bowling Has been watching carbs  A1c improved.    No Known Allergies  Current Outpatient Prescriptions  Medication Sig Dispense Refill  . aspirin 81 MG tablet Take 81 mg by mouth daily.        . clopidogrel (PLAVIX) 75 MG tablet Take 75 mg by mouth daily.      . CRESTOR 10 MG tablet take 1 tablet by mouth at bedtime  30 tablet  6  . levETIRAcetam (KEPPRA) 500 MG tablet Take 750 mg by mouth every 12 (twelve) hours.       Marland Kitchen levothyroxine (SYNTHROID, LEVOTHROID) 150 MCG tablet Take 150 mcg by mouth daily.        Marland Kitchen linagliptin (TRADJENTA) 5 MG TABS tablet Take 5 mg by mouth daily.      . metFORMIN (GLUCOPHAGE) 500 MG tablet Take by mouth. 2 tabs twice a day      . metoprolol succinate (TOPROL-XL) 25 MG 24 hr tablet Take 25 mg by mouth daily.         No current facility-administered medications for this visit.    Past Medical History  Diagnosis Date  . Other iatrogenic hypothyroidism   . Hyperlipidemia   . Coronary artery disease   . Diabetes mellitus     No past surgical history on file.  Family History  Problem Relation Age of Onset  . Heart attack Mother     age 56  . Heart attack Father     CABG    History   Social History  . Marital Status: Legally Separated    Spouse Name: N/A    Number of Children: N/A  . Years of Education: N/A   Occupational History  . Not on file.   Social History Main Topics  . Smoking  status: Never Smoker   . Smokeless tobacco: Not on file  . Alcohol Use: Yes  . Drug Use:   . Sexual Activity:    Other Topics Concern  . Not on file   Social History Narrative  . No narrative on file    Review of Systems:  All systems reviewed.  They are negative to the above problem except as previously stated.  Vital Signs: BP 114/76  Pulse 74  Ht 5' 11.5" (1.816 m)  Wt 245 lb (111.131 kg)  BMI 33.7 kg/m2  SpO2 95%  Physical Exam Patient is in NAd HEENT:  Normocephalic, atraumatic. EOMI, PERRLA.  Neck: JVP is normal.  No bruits.  Lungs: clear to auscultation. No rales no wheezes.  Heart: Regular rate and rhythm. Normal S1, S2. No S3.   No significant murmurs. PMI not displaced.  Abdomen:  Supple, nontender. Normal bowel sounds. No masses. No hepatomegaly.  Extremities:   Good distal pulses throughout. No lower extremity edema.  Musculoskeletal :moving all extremities.  Neuro:   alert and oriented x3.  CN II-XII grossly intact.  EKG  SR 74  Assessment and Plan:  1.  CAD  Patient without symptoms to suggest angina.  Continue meds   2.  HL  Will contact Dr Lanell Matar office re labs  3.  HCM  Counselled on wt loss and exercise.

## 2012-12-26 NOTE — Patient Instructions (Signed)
Your physician wants you to follow-up in: 1 year with Dr. Ross.  You will receive a reminder letter in the mail two months in advance. If you don't receive a letter, please call our office to schedule the follow-up appointment.  

## 2013-08-12 ENCOUNTER — Other Ambulatory Visit: Payer: Self-pay | Admitting: Internal Medicine

## 2013-10-14 ENCOUNTER — Encounter: Payer: Self-pay | Admitting: Internal Medicine

## 2013-11-10 ENCOUNTER — Other Ambulatory Visit: Payer: Self-pay | Admitting: Internal Medicine

## 2013-11-10 ENCOUNTER — Ambulatory Visit
Admission: RE | Admit: 2013-11-10 | Discharge: 2013-11-10 | Disposition: A | Discharge status: Home / Self Care | Payer: 59 | Source: Ambulatory Visit | Attending: Internal Medicine | Admitting: Internal Medicine

## 2013-11-10 DIAGNOSIS — M5489 Other dorsalgia: Secondary | ICD-10-CM

## 2013-11-10 DIAGNOSIS — M542 Cervicalgia: Secondary | ICD-10-CM

## 2013-11-26 ENCOUNTER — Ambulatory Visit: Payer: 59 | Admitting: Physical Therapy

## 2014-01-08 NOTE — Progress Notes (Signed)
HPI . [name] is a 58 year-old gentleman, who has a history of coronary artery disease, hypothyroidism, dyslipidemia, diabetes, pericardial cyst. I last saw him back in October of 2010. He is status post ST elevation MI in April 2007 has 3 bare-metal stents to the circumflex. Repeat catheterization 2009 showed a 60-65% mid RCA stenosis; small RV branch with 80% stenosis. D2 had a 70% ostial lesion. D1 40%. The stents in the LCX had 50% instent proximally.LVEF 55% overall unchanged.  He was in clinic last Oct 2014  Since seen he has done well  NO CP  Breating is OK  Busy  No dizziness    No Known Allergies  Current Outpatient Prescriptions  Medication Sig Dispense Refill  . aspirin 81 MG tablet Take 81 mg by mouth daily.        . clopidogrel (PLAVIX) 75 MG tablet Take 75 mg by mouth daily.      . CRESTOR 10 MG tablet take 1 tablet by mouth at bedtime  30 tablet  4  . levETIRAcetam (KEPPRA) 500 MG tablet Take 750 mg by mouth every 12 (twelve) hours.       Marland Kitchen levothyroxine (SYNTHROID, LEVOTHROID) 150 MCG tablet Take 150 mcg by mouth daily.        Marland Kitchen linagliptin (TRADJENTA) 5 MG TABS tablet Take 5 mg by mouth daily.      . metFORMIN (GLUCOPHAGE) 500 MG tablet Take by mouth. 2 tabs twice a day      . metoprolol succinate (TOPROL-XL) 25 MG 24 hr tablet Take 25 mg by mouth daily.         No current facility-administered medications for this visit.    Past Medical History  Diagnosis Date  . Other iatrogenic hypothyroidism   . Hyperlipidemia   . Coronary artery disease   . Diabetes mellitus     No past surgical history on file.  Family History  Problem Relation Age of Onset  . Heart attack Mother     age 68  . Heart attack Father     CABG    History   Social History  . Marital Status: Legally Separated    Spouse Name: N/A    Number of Children: N/A  . Years of Education: N/A   Occupational History  . Not on file.   Social History Main Topics  . Smoking status: Never Smoker   .  Smokeless tobacco: Not on file  . Alcohol Use: Yes  . Drug Use:   . Sexual Activity:    Other Topics Concern  . Not on file   Social History Narrative  . No narrative on file    Review of Systems:  All systems reviewed.  They are negative to the above problem except as previously stated.  Vital Signs: BP 116/74  Pulse 81  Ht 5\' 11"  (1.803 m)  Wt 242 lb (109.77 kg)  BMI 33.77 kg/m2  Physical Exam Patient is in NAd HEENT:  Normocephalic, atraumatic. EOMI, PERRLA.  Neck: JVP is normal.  No bruits.  Lungs: clear to auscultation. No rales no wheezes.  Heart: Regular rate and rhythm. Normal S1, S2. No S3.   No significant murmurs. PMI not displaced.  Abdomen:  Supple, nontender. Normal bowel sounds. No masses. No hepatomegaly.  Extremities:   Good distal pulses throughout. No lower extremity edema.  Musculoskeletal :moving all extremities.  Neuro:   alert and oriented x3.  CN II-XII grossly intact.   Assessment and Plan:  1.  CAD  Patient without symptoms to suggest angina.  Continue meds   2.  HL  Will contact Dr Jacquiline Doe office re labs  3.  HCM  Cont to recomm exercise, diet and wt loss

## 2014-01-09 ENCOUNTER — Encounter: Payer: Self-pay | Admitting: Internal Medicine

## 2014-01-09 ENCOUNTER — Ambulatory Visit (INDEPENDENT_AMBULATORY_CARE_PROVIDER_SITE_OTHER): Payer: 59 | Admitting: Internal Medicine

## 2014-01-09 VITALS — BP 116/74 | HR 81 | Ht 71.0 in | Wt 242.0 lb

## 2014-01-09 DIAGNOSIS — I2510 Atherosclerotic heart disease of native coronary artery without angina pectoris: Secondary | ICD-10-CM

## 2014-01-09 DIAGNOSIS — I10 Essential (primary) hypertension: Secondary | ICD-10-CM

## 2014-01-09 DIAGNOSIS — E785 Hyperlipidemia, unspecified: Secondary | ICD-10-CM

## 2014-01-09 NOTE — Patient Instructions (Addendum)
Your physician wants you to follow-up in:   [place] will 4a reminder letter in the mail two months in advance. If you don't receive a letter, please call our office to schedule the follow-up appointment. Your physician recommends that you continue on your current medications as directed. Please refer to the Current Medication list given to you today.

## 2014-02-04 ENCOUNTER — Other Ambulatory Visit: Payer: Self-pay | Admitting: Internal Medicine

## 2014-02-18 ENCOUNTER — Other Ambulatory Visit: Payer: Self-pay | Admitting: Internal Medicine

## 2014-02-18 DIAGNOSIS — M5136 Other intervertebral disc degeneration, lumbar region: Secondary | ICD-10-CM

## 2014-02-20 ENCOUNTER — Other Ambulatory Visit: Payer: Self-pay | Admitting: Internal Medicine

## 2014-02-20 ENCOUNTER — Ambulatory Visit
Admission: RE | Admit: 2014-02-20 | Discharge: 2014-02-20 | Disposition: A | Discharge status: Home / Self Care | Payer: 59 | Source: Ambulatory Visit | Attending: Internal Medicine | Admitting: Internal Medicine

## 2014-02-20 DIAGNOSIS — M5136 Other intervertebral disc degeneration, lumbar region: Secondary | ICD-10-CM

## 2014-03-02 ENCOUNTER — Ambulatory Visit
Admission: RE | Admit: 2014-03-02 | Discharge: 2014-03-02 | Disposition: A | Discharge status: Home / Self Care | Payer: 59 | Source: Ambulatory Visit | Attending: Internal Medicine | Admitting: Internal Medicine

## 2014-03-02 VITALS — BP 165/82 | HR 74

## 2014-03-02 DIAGNOSIS — M5136 Other intervertebral disc degeneration, lumbar region: Secondary | ICD-10-CM

## 2014-03-02 DIAGNOSIS — M5137 Other intervertebral disc degeneration, lumbosacral region: Secondary | ICD-10-CM

## 2014-03-02 MED ORDER — IOHEXOL 180 MG/ML  SOLN
1.0000 mL | Freq: Once | INTRAMUSCULAR | Status: AC | PRN
  Administered 2014-03-02: 1 mL via EPIDURAL

## 2014-03-02 MED ORDER — METHYLPREDNISOLONE ACETATE 40 MG/ML INJ SUSP (RADIOLOG
120.0000 mg | Freq: Once | INTRAMUSCULAR | Status: AC
  Administered 2014-03-02: 120 mg via EPIDURAL

## 2014-03-02 NOTE — Discharge Instructions (Signed)

## 2014-03-14 ENCOUNTER — Encounter (HOSPITAL_BASED_OUTPATIENT_CLINIC_OR_DEPARTMENT_OTHER): Payer: Self-pay | Admitting: Emergency Medicine

## 2014-03-14 ENCOUNTER — Emergency Department (HOSPITAL_BASED_OUTPATIENT_CLINIC_OR_DEPARTMENT_OTHER): Payer: 59

## 2014-03-14 ENCOUNTER — Emergency Department (HOSPITAL_BASED_OUTPATIENT_CLINIC_OR_DEPARTMENT_OTHER)
Admission: EM | Admit: 2014-03-14 | Discharge: 2014-03-14 | Disposition: A | Discharge status: Home / Self Care | Payer: 59 | Attending: Emergency Medicine | Admitting: Emergency Medicine

## 2014-03-14 DIAGNOSIS — Z7982 Long term (current) use of aspirin: Secondary | ICD-10-CM | POA: Insufficient documentation

## 2014-03-14 DIAGNOSIS — S2239XA Fracture of one rib, unspecified side, initial encounter for closed fracture: Secondary | ICD-10-CM

## 2014-03-14 DIAGNOSIS — W01198A Fall on same level from slipping, tripping and stumbling with subsequent striking against other object, initial encounter: Secondary | ICD-10-CM | POA: Diagnosis not present

## 2014-03-14 DIAGNOSIS — S2231XA Fracture of one rib, right side, initial encounter for closed fracture: Secondary | ICD-10-CM | POA: Insufficient documentation

## 2014-03-14 DIAGNOSIS — E032 Hypothyroidism due to medicaments and other exogenous substances: Secondary | ICD-10-CM | POA: Insufficient documentation

## 2014-03-14 DIAGNOSIS — Y998 Other external cause status: Secondary | ICD-10-CM | POA: Insufficient documentation

## 2014-03-14 DIAGNOSIS — S29001A Unspecified injury of muscle and tendon of front wall of thorax, initial encounter: Secondary | ICD-10-CM | POA: Diagnosis present

## 2014-03-14 DIAGNOSIS — E119 Type 2 diabetes mellitus without complications: Secondary | ICD-10-CM | POA: Diagnosis not present

## 2014-03-14 DIAGNOSIS — Y9289 Other specified places as the place of occurrence of the external cause: Secondary | ICD-10-CM | POA: Diagnosis not present

## 2014-03-14 DIAGNOSIS — E785 Hyperlipidemia, unspecified: Secondary | ICD-10-CM | POA: Diagnosis not present

## 2014-03-14 DIAGNOSIS — Y9389 Activity, other specified: Secondary | ICD-10-CM | POA: Diagnosis not present

## 2014-03-14 DIAGNOSIS — I2510 Atherosclerotic heart disease of native coronary artery without angina pectoris: Secondary | ICD-10-CM | POA: Insufficient documentation

## 2014-03-14 DIAGNOSIS — Z79899 Other long term (current) drug therapy: Secondary | ICD-10-CM | POA: Diagnosis not present

## 2014-03-14 DIAGNOSIS — Z7902 Long term (current) use of antithrombotics/antiplatelets: Secondary | ICD-10-CM | POA: Insufficient documentation

## 2014-03-14 DIAGNOSIS — R52 Pain, unspecified: Secondary | ICD-10-CM

## 2014-03-14 MED ORDER — OXYCODONE-ACETAMINOPHEN 5-325 MG PO TABS: 1.0000 | ORAL_TABLET | ORAL | Status: DC | PRN

## 2014-03-14 NOTE — Discharge Instructions (Signed)
Take the prescribed medication as directed.  May take motrin or other anti-inflammatories in between dosing if needed. Follow-up with your primary care physician. Return to the ED for new or worsening symptoms.

## 2014-03-14 NOTE — ED Provider Notes (Signed)
CSN: [id number]     Arrival date & time 03/14/14  1902 History   First MD Initiated Contact with Patient 03/14/14 2033     Chief Complaint  Patient presents with  . Fall     (Consider location/radiation/quality/duration/timing/severity/associated sxs/prior Treatment) Patient is a 58 y.o. male presenting with fall. The history is provided by the patient and medical records.  Fall Associated symptoms include arthralgias.    This is a 58 year old male with past medical history significant for coronary artery disease, diabetes, hyperlipidemia, hypothyroidism, presenting to the ED fall 2 days ago. Patient states he tripped in his garage and hit his right shoulder and ribs on a table. No head injury or loss of consciousness. States since this time his shoulder feels somewhat sore, but he is at increased pain of his right ribs. He reports increased pain with deep breathing and twisting movements. He denies any chest pain or shortness of breath. Patient is concerned that he may have a rib fracture. He has taken over-the-counter pain medications with minimal improvement of symptoms.  VS stable on arrival.  Past Medical History  Diagnosis Date  . Other iatrogenic hypothyroidism   . Hyperlipidemia   . Coronary artery disease   . Diabetes mellitus    History reviewed. No pertinent past surgical history. Family History  Problem Relation Age of Onset  . Heart attack Mother     age 66  . Heart attack Father     CABG   History  Substance Use Topics  . Smoking status: Never Smoker   . Smokeless tobacco: Not on file  . Alcohol Use: Yes    Review of Systems  Musculoskeletal: Positive for arthralgias.  All other systems reviewed and are negative.     Allergies  Review of patient's allergies indicates no known allergies.  Home Medications   Prior to Admission medications   Medication Sig Start Date End Date Taking? Authorizing Provider  aspirin 81 MG tablet Take 81 mg by mouth daily.       Historical Provider, MD  clopidogrel (PLAVIX) 75 MG tablet Take 75 mg by mouth daily.    Historical Provider, MD  CRESTOR 10 MG tablet take 1 tablet by mouth at bedtime 02/06/14   Fay Records, MD  levETIRAcetam (KEPPRA) 500 MG tablet Take 750 mg by mouth every 12 (twelve) hours.     Historical Provider, MD  levothyroxine (SYNTHROID, LEVOTHROID) 150 MCG tablet Take 150 mcg by mouth daily.      Historical Provider, MD  linagliptin (TRADJENTA) 5 MG TABS tablet Take 5 mg by mouth daily.    Historical Provider, MD  metFORMIN (GLUCOPHAGE) 500 MG tablet Take by mouth. 2 tabs twice a day    Historical Provider, MD  metoprolol succinate (TOPROL-XL) 25 MG 24 hr tablet Take 25 mg by mouth daily.      Historical Provider, MD   BP 140/77 mmHg  Pulse 79  Temp(Src) 98.1 F (36.7 C)  Resp 18  Ht 5' 11.5" (1.816 m)  Wt 235 lb (106.595 kg)  BMI 32.32 kg/m2  SpO2 97%   Physical Exam  Constitutional: He is oriented to person, place, and time. He appears well-developed and well-nourished.  HENT:  Head: Normocephalic and atraumatic.  Mouth/Throat: Oropharynx is clear and moist.  Eyes: Conjunctivae and EOM are normal. Pupils are equal, round, and reactive to light.  Neck: Normal range of motion.  Cardiovascular: Normal rate, regular rhythm and normal heart sounds.   Pulmonary/Chest: Effort normal and breath  sounds normal.    Middle right lateral and posterior ribs TTP without noted bruising or signs of trauma; no deformities or crepitus; lungs clear bilaterally  Abdominal: Soft. Bowel sounds are normal.  Musculoskeletal: Normal range of motion.       Right shoulder: Normal.  Right shoulder exam WNL; normal strength  Neurological: He is alert and oriented to person, place, and time.  Skin: Skin is warm and dry.  Psychiatric: He has a normal mood and affect.  Nursing note and vitals reviewed.   ED Course  Procedures (including critical care time) Labs Review Labs Reviewed - No data to  display  Imaging Review Dg Ribs Unilateral W/chest Right  03/14/2014   CLINICAL DATA:  RIGHT shoulder pain after falling 2 days ago. Pain worse with inspiration.  EXAM: RIGHT RIBS AND CHEST - 3+ VIEW  COMPARISON:  Chest radiograph November 10, 2013  FINDINGS: Cardiomediastinal silhouette is unremarkable. The lungs are clear without pleural effusions or focal consolidations. Trachea projects midline and there is no pneumothorax. Soft tissue planes and included osseous structures are non-suspicious. No rib fracture deformity.  IMPRESSION: No acute cardiopulmonary process.  No rib fracture deformity.   Electronically Signed   By: Elon Alas   On: 03/14/2014 21:39   Dg Shoulder Right  03/14/2014   CLINICAL DATA:  Right shoulder pain after fall 2 days ago.  EXAM: RIGHT SHOULDER - 2+ VIEW  COMPARISON:  None.  FINDINGS: Degenerative changes in the right AC and glenohumeral joints. No shoulder fracture, subluxation or dislocation.  There is a right lateral seventh rib fracture. There appears to be associated pleural thickening. This is age indeterminate. Recommend clinical correlation for pain in this area. No pneumothorax.  IMPRESSION: No acute findings in the right shoulder.  Right lateral seventh rib fracture, age indeterminate. Recommend correlation for pain in this area.   Electronically Signed   By: Rolm Baptise M.D.   On: 03/14/2014 20:06     EKG Interpretation None      MDM   Final diagnoses:  Rib fracture   58 year old male with fall on right shoulder and right ribs 2 days ago. On exam, right shoulder normal in appearance. There is some tenderness of right lateral and posterior ribs without crepitus or deformity. Lungs clear bilaterally. Shoulder x-ray was obtained in triage revealing seventh rib fracture. After assessment I added dedicated rib films which are read as negative, however when I have reviewed films there is an obvious fracture of right seventh rib, most clearly seen on the  AP upper right view.  Patient remains without any respiratory distress, VS stable on RA.  Rx percocet.  FU with PCP.  Discussed plan with patient, he/she acknowledged understanding and agreed with plan of care.  Return precautions given for new or worsening symptoms.  Larene Pickett, PA-C 03/14/14 2308  Veryl Speak, MD 03/15/14 [phone]

## 2014-03-14 NOTE — ED Notes (Signed)
Pt presents to ED with complaints of right shoulder pain after falling 2 days ago.

## 2014-07-26 ENCOUNTER — Other Ambulatory Visit: Payer: Self-pay | Admitting: Internal Medicine

## 2014-10-19 ENCOUNTER — Encounter: Payer: Self-pay | Admitting: Internal Medicine

## 2015-02-10 ENCOUNTER — Encounter: Payer: Self-pay | Admitting: Internal Medicine

## 2015-02-18 ENCOUNTER — Encounter: Payer: Self-pay | Admitting: Internal Medicine

## 2015-02-18 ENCOUNTER — Ambulatory Visit (INDEPENDENT_AMBULATORY_CARE_PROVIDER_SITE_OTHER): Payer: 59 | Admitting: Internal Medicine

## 2015-02-18 VITALS — BP 134/82 | HR 79 | Ht 71.5 in | Wt 238.4 lb

## 2015-02-18 DIAGNOSIS — E785 Hyperlipidemia, unspecified: Secondary | ICD-10-CM

## 2015-02-18 NOTE — Progress Notes (Signed)
Cardiology Office Note   Date:  02/18/2015   ID:  [name], DOB 06-07-1955, MRN [id number]  PCP:  Geoffery Lyons, MD  Cardiologist:   Dorris Carnes, MD   No chief complaint on file.     History of Present Illness: [name] is a 59 y.o. male with a history of   CAD, hypothyoidism, HL, DM, pericardial cysti.  I saw in in October 2015  He is status post ST elevation MI in April 2007 has 3 bare-metal stents to the circumflex. Repeat catheterization 2009 showed a 60-65% mid RCA stenosis; small RV branch with 80% stenosis. D2 had a 70% ostial lesion. D1 40%. The stents in the LCX had 50% instent proximally.LVEF 55% overall unchanged.   SInce seen he has done well  Breathing OK  No CP  Active.  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 aspirin 81 MG tablet Take 81 mg by mouth daily.      . clopidogrel (PLAVIX) 75 MG tablet Take 75 mg by mouth daily.    . CRESTOR 10 MG tablet take 1 tablet by mouth at bedtime 30 tablet 3  . levETIRAcetam (KEPPRA) 500 MG tablet Take 750 mg by mouth every 12 (twelve) hours.     Marland Kitchen levothyroxine (SYNTHROID, LEVOTHROID) 150 MCG tablet Take 150 mcg by mouth daily.      Marland Kitchen linagliptin (TRADJENTA) 5 MG TABS tablet Take 5 mg by mouth daily.    . metFORMIN (GLUCOPHAGE) 500 MG tablet Take by mouth. 2 tabs twice a day    . metoprolol succinate (TOPROL-XL) 25 MG 24 hr tablet Take 25 mg by mouth daily.       No current facility-administered medications for this visit.    Allergies:   Review of patient's allergies indicates no known allergies.   Past Medical History  Diagnosis Date  . Other iatrogenic hypothyroidism   . Hyperlipidemia   . Coronary artery disease   . Diabetes mellitus     No past surgical history on file.   Social History:  The patient  reports that he has never smoked. He does not have any smokeless tobacco history on file. He reports that  he drinks alcohol. He reports that he does not use illicit drugs.   Family History:  The patient's family history includes Heart attack in his father and mother.    ROS:  Please see the history of present illness. All other systems are reviewed and  Negative to the above problem except as noted.    PHYSICAL EXAM: VS:  BP 134/82 mmHg  Pulse 79  Ht 5' 11.5" (1.816 m)  Wt 108.138 kg (238 lb 6.4 oz)  BMI 32.79 kg/m2  GEN: Well nourished, well developed, in no acute distress HEENT: normal Neck: no JVD, carotid bruits, or masses Cardiac: RRR; no murmurs, rubs, or gallops,no edema  Respiratory:  clear to auscultation bilaterally, normal work of breathing GI: soft, nontender, nondistended, + BS  No hepatomegaly  MS: no deformity Moving all extremities   Skin: warm and dry, no rash Neuro:  Strength and sensation are intact Psych: euthymic mood, full affect   EKG:  EKG is ordered today.  SR 79 bpm     Lipid Panel    Component Value Date/Time   CHOL 104 04/19/2009 0837   TRIG 152.0* 04/19/2009 0837   HDL 31.40* 04/19/2009 [id number]  CHOLHDL 3 04/19/2009 0837   VLDL 30.4 04/19/2009 0837   LDLCALC 42 04/19/2009 0837      Wt Readings from Last 3 Encounters:  02/18/15 108.138 kg (238 lb 6.4 oz)  03/14/14 106.595 kg (235 lb)  02/20/14 108.863 kg (240 lb)      ASSESSMENT AND PLAN:  1  CAD  Doing well  I am not susp of any active angina  Encouraged him to lose wt On ASA and plavix given multiple stents  Check CBC   2  HL  Will get labs from Dr Harley Hallmark office  3.  Obesity  Discussed wt loss and exercise  Pt trying  4.  DM  Again, wt loss  F/U in 1 year     Signed, Dorris Carnes, MD  02/18/2015 10:46 AM    [address], [place], [place]  [address] Phone: [phone]; Fax: [phone]

## 2015-02-18 NOTE — Patient Instructions (Signed)
Your physician recommends that you continue on your current medications as directed. Please refer to the Current Medication list given to you today. Your physician wants you to follow-up in: 1 year with Dr. Ross.  You will receive a reminder letter in the mail two months in advance. If you don't receive a letter, please call our office to schedule the follow-up appointment.  

## 2015-11-02 ENCOUNTER — Encounter: Payer: Self-pay | Admitting: Internal Medicine

## 2015-12-27 ENCOUNTER — Telehealth: Payer: Self-pay | Admitting: *Deleted

## 2015-12-27 ENCOUNTER — Telehealth: Payer: Self-pay | Admitting: Internal Medicine

## 2015-12-27 NOTE — Telephone Encounter (Signed)
Walk In pt Form-[facility]-Clearance dropped off. Gave to [employer]

## 2015-12-27 NOTE — Telephone Encounter (Signed)
Received patient walk in form. From [facility] date is pending Procedure: left shoulder scope, SAD, DCR, RCR Is patient cleared for surgery? Recommendations on Plavix and if need bridging. Will fax Dr. Alan Ripper recommendations to [facility]: Fax #: [phone]

## 2015-12-27 NOTE — Telephone Encounter (Signed)
Printed encounter and placed in nurse fax bin in medical records along with form from [facility].

## 2015-12-27 NOTE — Telephone Encounter (Signed)
OK to stop Plavix  5 days prior  Resume when safe after

## 2015-12-31 ENCOUNTER — Telehealth: Payer: Self-pay | Admitting: Internal Medicine

## 2015-12-31 NOTE — Telephone Encounter (Signed)
Patient asked if [facility] Clearance could be re-faxed. I have faxed to Sherri at [phone].

## 2016-01-06 ENCOUNTER — Telehealth: Payer: Self-pay | Admitting: Internal Medicine

## 2016-01-06 NOTE — Telephone Encounter (Signed)
Advised pt clearance was re-faxed 10/17 that had clearance for surgery checked.   Maudie Mercury, in medical records re-faxed per my request.  Confirmation received.

## 2016-01-06 NOTE — Telephone Encounter (Signed)
New message    Pt verbalized that he is calling to get papers signed that he brought in last week. by Dr.Ross and sent back by fax.  He wants Dr.Ross to check off on the surgical clearance

## 2016-02-09 ENCOUNTER — Encounter: Payer: Self-pay | Admitting: *Deleted
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
## 2016-02-18 ENCOUNTER — Ambulatory Visit (INDEPENDENT_AMBULATORY_CARE_PROVIDER_SITE_OTHER): Payer: 59 | Admitting: Internal Medicine

## 2016-02-18 ENCOUNTER — Encounter: Payer: Self-pay | Admitting: Internal Medicine

## 2016-02-18 VITALS — BP 134/80 | HR 80 | Ht 71.5 in | Wt 243.2 lb

## 2016-02-18 DIAGNOSIS — I2510 Atherosclerotic heart disease of native coronary artery without angina pectoris: Secondary | ICD-10-CM | POA: Diagnosis not present

## 2016-02-18 NOTE — Patient Instructions (Signed)
Your physician recommends that you continue on your current medications as directed. Please refer to the Current Medication list given to you today. Your physician wants you to follow-up in: 1 year with Dr. Ross.  You will receive a reminder letter in the mail two months in advance. If you don't receive a letter, please call our office to schedule the follow-up appointment.  

## 2016-03-08 ENCOUNTER — Telehealth: Payer: Self-pay | Admitting: *Deleted

## 2016-03-08 NOTE — Telephone Encounter (Signed)
Patient informed. 

## 2016-03-08 NOTE — Telephone Encounter (Signed)
-----   Message from Fay Records, MD sent at 03/08/2016  1:19 AM EST ----- LDL is great  COntinue current meds

## 2016-03-20 HISTORY — PX: SHOULDER ARTHROSCOPY WITH OPEN ROTATOR CUFF REPAIR: SHX6092

## 2016-03-21 DIAGNOSIS — M75102 Unspecified rotator cuff tear or rupture of left shoulder, not specified as traumatic: Secondary | ICD-10-CM | POA: Diagnosis not present

## 2016-03-22 DIAGNOSIS — E291 Testicular hypofunction: Secondary | ICD-10-CM | POA: Diagnosis not present

## 2016-03-24 DIAGNOSIS — M75102 Unspecified rotator cuff tear or rupture of left shoulder, not specified as traumatic: Secondary | ICD-10-CM | POA: Diagnosis not present

## 2016-03-28 DIAGNOSIS — M75102 Unspecified rotator cuff tear or rupture of left shoulder, not specified as traumatic: Secondary | ICD-10-CM | POA: Diagnosis not present

## 2016-04-04 DIAGNOSIS — E291 Testicular hypofunction: Secondary | ICD-10-CM | POA: Diagnosis not present

## 2016-04-11 DIAGNOSIS — M75102 Unspecified rotator cuff tear or rupture of left shoulder, not specified as traumatic: Secondary | ICD-10-CM | POA: Diagnosis not present

## 2016-04-13 DIAGNOSIS — M75102 Unspecified rotator cuff tear or rupture of left shoulder, not specified as traumatic: Secondary | ICD-10-CM | POA: Diagnosis not present

## 2016-04-13 DIAGNOSIS — E291 Testicular hypofunction: Secondary | ICD-10-CM | POA: Diagnosis not present

## 2016-04-18 DIAGNOSIS — M75102 Unspecified rotator cuff tear or rupture of left shoulder, not specified as traumatic: Secondary | ICD-10-CM | POA: Diagnosis not present

## 2016-04-20 DIAGNOSIS — M75102 Unspecified rotator cuff tear or rupture of left shoulder, not specified as traumatic: Secondary | ICD-10-CM | POA: Diagnosis not present

## 2016-04-20 DIAGNOSIS — R509 Fever, unspecified: Secondary | ICD-10-CM | POA: Diagnosis not present

## 2016-05-02 DIAGNOSIS — M75102 Unspecified rotator cuff tear or rupture of left shoulder, not specified as traumatic: Secondary | ICD-10-CM | POA: Diagnosis not present

## 2016-05-03 DIAGNOSIS — Z794 Long term (current) use of insulin: Secondary | ICD-10-CM | POA: Diagnosis not present

## 2016-05-03 DIAGNOSIS — M5380 Other specified dorsopathies, site unspecified: Secondary | ICD-10-CM | POA: Diagnosis not present

## 2016-05-03 DIAGNOSIS — E1151 Type 2 diabetes mellitus with diabetic peripheral angiopathy without gangrene: Secondary | ICD-10-CM | POA: Diagnosis not present

## 2016-05-03 DIAGNOSIS — Z1389 Encounter for screening for other disorder: Secondary | ICD-10-CM | POA: Diagnosis not present

## 2016-05-04 DIAGNOSIS — E291 Testicular hypofunction: Secondary | ICD-10-CM | POA: Diagnosis not present

## 2016-05-04 DIAGNOSIS — M75102 Unspecified rotator cuff tear or rupture of left shoulder, not specified as traumatic: Secondary | ICD-10-CM | POA: Diagnosis not present

## 2016-05-09 DIAGNOSIS — M75102 Unspecified rotator cuff tear or rupture of left shoulder, not specified as traumatic: Secondary | ICD-10-CM | POA: Diagnosis not present

## 2016-05-11 DIAGNOSIS — E291 Testicular hypofunction: Secondary | ICD-10-CM | POA: Diagnosis not present

## 2016-05-12 DIAGNOSIS — M75102 Unspecified rotator cuff tear or rupture of left shoulder, not specified as traumatic: Secondary | ICD-10-CM | POA: Diagnosis not present

## 2016-05-16 DIAGNOSIS — M75102 Unspecified rotator cuff tear or rupture of left shoulder, not specified as traumatic: Secondary | ICD-10-CM | POA: Diagnosis not present

## 2016-05-18 DIAGNOSIS — E291 Testicular hypofunction: Secondary | ICD-10-CM | POA: Diagnosis not present

## 2016-05-18 DIAGNOSIS — M75102 Unspecified rotator cuff tear or rupture of left shoulder, not specified as traumatic: Secondary | ICD-10-CM | POA: Diagnosis not present

## 2016-05-23 DIAGNOSIS — M75102 Unspecified rotator cuff tear or rupture of left shoulder, not specified as traumatic: Secondary | ICD-10-CM | POA: Diagnosis not present

## 2016-05-25 DIAGNOSIS — E291 Testicular hypofunction: Secondary | ICD-10-CM | POA: Diagnosis not present

## 2016-05-25 DIAGNOSIS — M75102 Unspecified rotator cuff tear or rupture of left shoulder, not specified as traumatic: Secondary | ICD-10-CM | POA: Diagnosis not present

## 2016-05-30 DIAGNOSIS — M75102 Unspecified rotator cuff tear or rupture of left shoulder, not specified as traumatic: Secondary | ICD-10-CM | POA: Diagnosis not present

## 2016-05-31 DIAGNOSIS — E291 Testicular hypofunction: Secondary | ICD-10-CM | POA: Diagnosis not present

## 2016-06-01 DIAGNOSIS — M75102 Unspecified rotator cuff tear or rupture of left shoulder, not specified as traumatic: Secondary | ICD-10-CM | POA: Diagnosis not present

## 2016-06-06 DIAGNOSIS — M75102 Unspecified rotator cuff tear or rupture of left shoulder, not specified as traumatic: Secondary | ICD-10-CM | POA: Diagnosis not present

## 2016-06-08 DIAGNOSIS — E291 Testicular hypofunction: Secondary | ICD-10-CM | POA: Diagnosis not present

## 2016-06-08 DIAGNOSIS — M75102 Unspecified rotator cuff tear or rupture of left shoulder, not specified as traumatic: Secondary | ICD-10-CM | POA: Diagnosis not present

## 2016-06-13 DIAGNOSIS — M75102 Unspecified rotator cuff tear or rupture of left shoulder, not specified as traumatic: Secondary | ICD-10-CM | POA: Diagnosis not present

## 2016-06-14 DIAGNOSIS — Z4789 Encounter for other orthopedic aftercare: Secondary | ICD-10-CM | POA: Diagnosis not present

## 2016-06-14 DIAGNOSIS — M75112 Incomplete rotator cuff tear or rupture of left shoulder, not specified as traumatic: Secondary | ICD-10-CM | POA: Diagnosis not present

## 2016-06-15 DIAGNOSIS — E291 Testicular hypofunction: Secondary | ICD-10-CM | POA: Diagnosis not present

## 2016-06-27 DIAGNOSIS — M75102 Unspecified rotator cuff tear or rupture of left shoulder, not specified as traumatic: Secondary | ICD-10-CM | POA: Diagnosis not present

## 2016-06-29 DIAGNOSIS — E291 Testicular hypofunction: Secondary | ICD-10-CM | POA: Diagnosis not present

## 2016-07-04 DIAGNOSIS — M75102 Unspecified rotator cuff tear or rupture of left shoulder, not specified as traumatic: Secondary | ICD-10-CM | POA: Diagnosis not present

## 2016-07-06 DIAGNOSIS — E291 Testicular hypofunction: Secondary | ICD-10-CM | POA: Diagnosis not present

## 2016-07-11 DIAGNOSIS — M75102 Unspecified rotator cuff tear or rupture of left shoulder, not specified as traumatic: Secondary | ICD-10-CM | POA: Diagnosis not present

## 2016-07-13 DIAGNOSIS — E291 Testicular hypofunction: Secondary | ICD-10-CM | POA: Diagnosis not present

## 2016-07-18 DIAGNOSIS — M75102 Unspecified rotator cuff tear or rupture of left shoulder, not specified as traumatic: Secondary | ICD-10-CM | POA: Diagnosis not present

## 2016-07-19 DIAGNOSIS — Z4789 Encounter for other orthopedic aftercare: Secondary | ICD-10-CM | POA: Diagnosis not present

## 2016-07-19 DIAGNOSIS — M75112 Incomplete rotator cuff tear or rupture of left shoulder, not specified as traumatic: Secondary | ICD-10-CM | POA: Diagnosis not present

## 2016-07-20 DIAGNOSIS — E291 Testicular hypofunction: Secondary | ICD-10-CM | POA: Diagnosis not present

## 2016-07-28 DIAGNOSIS — E291 Testicular hypofunction: Secondary | ICD-10-CM | POA: Diagnosis not present

## 2016-08-03 DIAGNOSIS — E291 Testicular hypofunction: Secondary | ICD-10-CM | POA: Diagnosis not present

## 2016-08-10 DIAGNOSIS — E291 Testicular hypofunction: Secondary | ICD-10-CM | POA: Diagnosis not present

## 2016-08-17 DIAGNOSIS — E291 Testicular hypofunction: Secondary | ICD-10-CM | POA: Diagnosis not present

## 2016-08-24 DIAGNOSIS — E291 Testicular hypofunction: Secondary | ICD-10-CM | POA: Diagnosis not present

## 2016-08-31 DIAGNOSIS — E291 Testicular hypofunction: Secondary | ICD-10-CM | POA: Diagnosis not present

## 2016-08-31 DIAGNOSIS — E784 Other hyperlipidemia: Secondary | ICD-10-CM | POA: Diagnosis not present

## 2016-08-31 DIAGNOSIS — E1151 Type 2 diabetes mellitus with diabetic peripheral angiopathy without gangrene: Secondary | ICD-10-CM | POA: Diagnosis not present

## 2016-08-31 DIAGNOSIS — I10 Essential (primary) hypertension: Secondary | ICD-10-CM | POA: Diagnosis not present

## 2016-09-07 DIAGNOSIS — E291 Testicular hypofunction: Secondary | ICD-10-CM | POA: Diagnosis not present

## 2016-09-21 DIAGNOSIS — E291 Testicular hypofunction: Secondary | ICD-10-CM | POA: Diagnosis not present

## 2016-09-26 DIAGNOSIS — E119 Type 2 diabetes mellitus without complications: Secondary | ICD-10-CM | POA: Diagnosis not present

## 2016-09-27 DIAGNOSIS — Z4789 Encounter for other orthopedic aftercare: Secondary | ICD-10-CM | POA: Diagnosis not present

## 2016-09-27 DIAGNOSIS — M75112 Incomplete rotator cuff tear or rupture of left shoulder, not specified as traumatic: Secondary | ICD-10-CM | POA: Diagnosis not present

## 2016-09-28 DIAGNOSIS — E291 Testicular hypofunction: Secondary | ICD-10-CM | POA: Diagnosis not present

## 2016-10-05 DIAGNOSIS — E291 Testicular hypofunction: Secondary | ICD-10-CM | POA: Diagnosis not present

## 2016-10-12 DIAGNOSIS — E291 Testicular hypofunction: Secondary | ICD-10-CM | POA: Diagnosis not present

## 2016-10-26 DIAGNOSIS — E291 Testicular hypofunction: Secondary | ICD-10-CM | POA: Diagnosis not present

## 2016-11-09 DIAGNOSIS — E291 Testicular hypofunction: Secondary | ICD-10-CM | POA: Diagnosis not present

## 2016-11-16 DIAGNOSIS — E291 Testicular hypofunction: Secondary | ICD-10-CM | POA: Diagnosis not present

## 2016-11-23 DIAGNOSIS — E291 Testicular hypofunction: Secondary | ICD-10-CM | POA: Diagnosis not present

## 2016-11-28 DIAGNOSIS — E784 Other hyperlipidemia: Secondary | ICD-10-CM | POA: Diagnosis not present

## 2016-11-28 DIAGNOSIS — Z0000 Encounter for general adult medical examination without abnormal findings: Secondary | ICD-10-CM | POA: Diagnosis not present

## 2016-12-04 DIAGNOSIS — Z1389 Encounter for screening for other disorder: Secondary | ICD-10-CM | POA: Diagnosis not present

## 2016-12-04 DIAGNOSIS — E784 Other hyperlipidemia: Secondary | ICD-10-CM | POA: Diagnosis not present

## 2016-12-04 DIAGNOSIS — Z0000 Encounter for general adult medical examination without abnormal findings: Secondary | ICD-10-CM | POA: Diagnosis not present

## 2016-12-04 DIAGNOSIS — Z125 Encounter for screening for malignant neoplasm of prostate: Secondary | ICD-10-CM | POA: Diagnosis not present

## 2016-12-04 DIAGNOSIS — Z23 Encounter for immunization: Secondary | ICD-10-CM | POA: Diagnosis not present

## 2016-12-04 DIAGNOSIS — E1151 Type 2 diabetes mellitus with diabetic peripheral angiopathy without gangrene: Secondary | ICD-10-CM | POA: Diagnosis not present

## 2016-12-04 DIAGNOSIS — I10 Essential (primary) hypertension: Secondary | ICD-10-CM | POA: Diagnosis not present

## 2016-12-07 DIAGNOSIS — E291 Testicular hypofunction: Secondary | ICD-10-CM | POA: Diagnosis not present

## 2016-12-08 DIAGNOSIS — Z1212 Encounter for screening for malignant neoplasm of rectum: Secondary | ICD-10-CM | POA: Diagnosis not present

## 2016-12-11 DIAGNOSIS — E291 Testicular hypofunction: Secondary | ICD-10-CM | POA: Diagnosis not present

## 2016-12-14 DIAGNOSIS — E291 Testicular hypofunction: Secondary | ICD-10-CM | POA: Diagnosis not present

## 2016-12-21 DIAGNOSIS — E291 Testicular hypofunction: Secondary | ICD-10-CM | POA: Diagnosis not present

## 2016-12-28 DIAGNOSIS — E291 Testicular hypofunction: Secondary | ICD-10-CM | POA: Diagnosis not present

## 2017-01-11 DIAGNOSIS — E291 Testicular hypofunction: Secondary | ICD-10-CM | POA: Diagnosis not present

## 2017-01-17 HISTORY — PX: OTHER SURGICAL HISTORY: SHX169

## 2017-01-25 DIAGNOSIS — E291 Testicular hypofunction: Secondary | ICD-10-CM | POA: Diagnosis not present

## 2017-02-01 DIAGNOSIS — E291 Testicular hypofunction: Secondary | ICD-10-CM | POA: Diagnosis not present

## 2017-02-15 DIAGNOSIS — E291 Testicular hypofunction: Secondary | ICD-10-CM | POA: Diagnosis not present

## 2017-02-22 DIAGNOSIS — E291 Testicular hypofunction: Secondary | ICD-10-CM | POA: Diagnosis not present

## 2017-03-01 DIAGNOSIS — E291 Testicular hypofunction: Secondary | ICD-10-CM | POA: Diagnosis not present

## 2017-03-08 DIAGNOSIS — E291 Testicular hypofunction: Secondary | ICD-10-CM | POA: Diagnosis not present

## 2017-03-15 DIAGNOSIS — E291 Testicular hypofunction: Secondary | ICD-10-CM | POA: Diagnosis not present

## 2017-03-23 ENCOUNTER — Ambulatory Visit: Payer: 59 | Admitting: Internal Medicine

## 2017-03-23 ENCOUNTER — Encounter: Payer: Self-pay | Admitting: Internal Medicine

## 2017-03-23 ENCOUNTER — Encounter (INDEPENDENT_AMBULATORY_CARE_PROVIDER_SITE_OTHER): Payer: Self-pay

## 2017-03-23 VITALS — BP 146/80 | HR 82 | Ht 71.5 in | Wt 244.6 lb

## 2017-03-23 DIAGNOSIS — I10 Essential (primary) hypertension: Secondary | ICD-10-CM | POA: Diagnosis not present

## 2017-03-23 DIAGNOSIS — E782 Mixed hyperlipidemia: Secondary | ICD-10-CM

## 2017-03-23 DIAGNOSIS — I2510 Atherosclerotic heart disease of native coronary artery without angina pectoris: Secondary | ICD-10-CM

## 2017-03-23 NOTE — Progress Notes (Signed)
Cardiology Office Note   Date:  03/23/2017   ID:  [name], DOB 07/15/1955, MRN [id number]  PCP:  Burnard Bunting, MD  Cardiologist:   Dorris Carnes, MD    F?U of CAD      History of Present Illness: [name] is a 62 y.o. male with a history of CAD, HL, DM, pericardial cystic  I saw him in Dec 62   He is status post ST elevation MI in April 2007 has 3 bare-metal stents to the circumflex. Repeat catheterization 2009 showed a 60-65% mid RCA stenosis; small RV branch with 80% stenosis. D2 had a 70% ostial lesion. D1 40%. The stents in the LCX had 50% instent proximally.LVEF 55% overall unchanged.   I saw the pt in Dec 2017    No CP  No SOB    Hasnt gotten back to working out after rotator cuff surgery    LDL in Sept was 63  HD: 31     Outpatient Medications Prior to Visit  Medication Sig Dispense Refill  . aspirin 81 MG tablet Take 81 mg by mouth daily.      . clopidogrel (PLAVIX) 75 MG tablet Take 75 mg by mouth daily.    . CRESTOR 10 MG tablet take 1 tablet by mouth at bedtime 30 tablet 3  . levETIRAcetam (KEPPRA) 500 MG tablet Take 750 mg by mouth every 12 (twelve) hours.     Marland Kitchen levothyroxine (SYNTHROID, LEVOTHROID) 150 MCG tablet Take 137 mcg by mouth daily.     . metFORMIN (GLUCOPHAGE) 500 MG tablet Take by mouth. 2 tabs twice a day    . metoprolol succinate (TOPROL-XL) 25 MG 24 hr tablet Take 25 mg by mouth daily.       No facility-administered medications prior to visit.      Allergies:   Patient has no known allergies.   Past Medical History:  Diagnosis Date  . Coronary artery disease   . Diabetes mellitus   . Hyperlipidemia   . Other iatrogenic hypothyroidism     History reviewed. No pertinent surgical history.   Social History:  The patient  reports that  has never smoked. he has never used smokeless tobacco. He reports that he drinks alcohol. He reports that he does not use drugs.   Family History:  The patient's family history includes Heart  attack in his father and mother.    ROS:  Please see the history of present illness. All other systems are reviewed and  Negative to the above problem except as noted.    PHYSICAL EXAM: VS:  BP (!) 146/80   Pulse 82   Ht 5' 11.5" (1.816 m)   Wt 244 lb 9.6 oz (110.9 kg)   SpO2 96%   BMI 33.64 kg/m   GEN: Well nourished, well developed, in no acute distress  HEENT: normal  Neck: no JVD, carotid bruits, or masses Cardiac: RRR; no murmurs, rubs, or gallops,no edema  Respiratory:  clear to auscultation bilaterally, normal work of breathing GI: soft, nontender, nondistended, + BS  No hepatomegaly  MS: no deformity Moving all extremities   Skin: warm and dry, no rash Neuro:  Strength and sensation are intact Psych: euthymic mood, full affect   EKG:  EKG is  ordered today.  SR 82 bpm  IWMI     Lipid Panel    Component Value Date/Time   CHOL 104 04/19/2009 0837   TRIG 152.0 (H) 04/19/2009 0837   HDL 31.40 (L)  04/19/2009 0837   CHOLHDL 3 04/19/2009 0837   VLDL 30.4 04/19/2009 0837   LDLCALC 42 04/19/2009 0837      Wt Readings from Last 3 Encounters:  03/23/17 244 lb 9.6 oz (110.9 kg)  02/18/16 243 lb 3.2 oz (110.3 kg)  02/18/15 238 lb 6.4 oz (108.1 kg)      ASSESSMENT AND PLAN:  1  CAD   Doing OK  No anginal symptoms  I would keep on current regmien including plavix  CBC OK  2  HL  Continue Crestor   Increase activity    3  HTN  BP up a little  146/  To see Reynaldo Minium on 1/31  I told him to take BP at home Bring to Dr Reynaldo Minium appt at end of month   Goal 110s to 130s   4  ? Sciatica  Given name of Zac Tamala Julian    F/U in 1 year    Current medicines are reviewed at length with the patient today.  The patient does not have concerns regarding medicines.  Signed, Dorris Carnes, MD  03/23/2017 10:06 AM    [address], [place], [place]  [address] Phone: [phone]; Fax: [phone]

## 2017-03-23 NOTE — Patient Instructions (Signed)
Your physician recommends that you continue on your current medications as directed. Please refer to the Current Medication list given to you today. Your physician wants you to follow-up in: 1 year with Dr. Ross.  You will receive a reminder letter in the mail two months in advance. If you don't receive a letter, please call our office to schedule the follow-up appointment.  

## 2017-03-26 DIAGNOSIS — E291 Testicular hypofunction: Secondary | ICD-10-CM | POA: Diagnosis not present

## 2017-04-17 NOTE — Progress Notes (Signed)
[name] [place], [place] [address] Phone: [phone] Subjective:     CC: Back pain and right leg pain  VZD:[id number]  [name] is a 62 y.o. male coming in with complaint of back and right leg pain. He states he has right sided leg pain that he believes is sciatica. Numbness and tingling stops at calf. 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  Patient did see another provider and was given prednisone but this was quite some time ago.  Did have some difficulty with the blood sugar.  Onset- Chronic Location- Right back and leg Character- Sharp  Aggravating factors- Different movements  Reliving factors- "Pushing into it" Therapies tried- Muscle relaxer and pain meds Severity-8 out of 10 seems to be worsening.   Patient had an MRI of the lumbar spine in December 2015.  This was independently visualized by me.  Showed the patient did have a disc bulging causing the left L3 nerve root impingement.  Also seem to be an L4 nerve root impingement noted at L4-L5.  Patient had an epidural on March 02, 2014.  Past Medical History:  Diagnosis Date  . Coronary artery disease   . Diabetes mellitus   . Hyperlipidemia   . Other iatrogenic hypothyroidism    No past surgical history on file. Social History   Socioeconomic History  . Marital status: Legally Separated    Spouse name: Not on file  . Number of children: Not on file  . Years of education: Not on file  . Highest education level: Not on file  Social Needs  . Financial resource strain: Not on file  . Food insecurity - worry: Not on file  . Food insecurity - inability: Not on file  . Transportation needs - medical: Not on file  . Transportation needs - non-medical: Not on file  Occupational History  . Not on file  Tobacco Use  . Smoking status: Never Smoker  . Smokeless tobacco: Never Used  Substance and Sexual Activity  . Alcohol use: Yes  . Drug use:  No  . Sexual activity: Not on file  Other Topics Concern  . Not on file  Social History Narrative  . Not on file   No Known Allergies Family History  Problem Relation Age of Onset  . Heart attack Mother        age 44  . Heart attack Father        CABG     Past medical history, social, surgical and family history all reviewed in electronic medical record.  No pertanent information unless stated regarding to the chief complaint.   Review of Systems:Review of systems updated and as accurate as of 04/17/17  No headache, visual changes, nausea, vomiting, diarrhea, constipation, dizziness, abdominal pain, skin rash, fevers, chills, night sweats, weight loss, swollen lymph nodes, body aches, joint swelling, muscle aches, chest pain, shortness of breath, mood changes.   Objective  There were no vitals taken for this visit. Systems examined below as of 04/17/17   General: No apparent distress alert and oriented x3 mood and affect normal, dressed appropriately.  HEENT: Pupils equal, extraocular movements intact  Respiratory: Patient's speak in full sentences and does not appear short of breath  Cardiovascular: No lower extremity edema, non tender, no erythema  Skin: Warm dry intact with no signs of infection or rash on extremities or on axial skeleton.  Abdomen: Soft nontender  Neuro:  Cranial nerves II through XII are intact, neurovascularly intact in all extremities with 2+ DTRs and 2+ pulses.  Lymph: No lymphadenopathy of posterior or anterior cervical chain or axillae bilaterally.  Gait normal with good balance and coordination.  MSK:  Non tender with full range of motion and good stability and symmetric strength and tone of shoulders, elbows, wrist, hip, knee and ankles bilaterally.  Back Exam:  Inspection: Loss of lordosis Motion: Flexion 45 deg, Extension 25 deg, Side Bending to 35 deg bilaterally,  Rotation to 35 deg bilaterally  SLR laying: Positive right at 30 degrees XSLR  laying: Negative  Palpable tenderness: Severe tenderness to palpation in the paraspinal musculature lumbar spine right greater than left.Marland Kitchen FABER: negative. Sensory change: Gross sensation intact to all lumbar and sacral dermatomes.  Reflexes: 2+ at both patellar tendons, 2+ at achilles tendons, Babinski's downgoing.  Strength at foot  Plantar-flexion: 5/5 Dorsi-flexion: 5/5 Eversion: 5/5 Inversion: 5/5  Leg strength  Quad: 5/5 Hamstring: 5/5 Hip flexor: 5/5 Hip abductors: 4/5 but symmetric Gait unremarkable.  [id number]; 15 additional minutes spent for Therapeutic exercises as stated in above notes.  This included exercises focusing on stretching, strengthening, with significant focus on eccentric aspects.   Long term goals include an improvement in range of motion, strength, endurance as well as avoiding reinjury. Patient's frequency would include in 1-2 times a day, 3-5 times a week for a duration of 6-12 weeks. Low back exercises that included:  Pelvic tilt/bracing instruction to focus on control of the pelvic girdle and lower abdominal muscles  Glute strengthening exercises, focusing on proper firing of the glutes without engaging the low back muscles Proper stretching techniques for maximum relief for the hamstrings, hip flexors, low back and some rotation where tolerated  Proper technique shown and discussed handout in great detail with ATC.  All questions were discussed and answered.     Impression and Recommendations:     This case required medical decision making of moderate complexity.      Note: This dictation was prepared with Dragon dictation along with smaller phrase technology. Any transcriptional errors that result from this process are unintentional.

## 2017-04-18 ENCOUNTER — Encounter: Payer: Self-pay | Admitting: Family Medicine

## 2017-04-18 ENCOUNTER — Ambulatory Visit: Payer: 59 | Admitting: Family Medicine

## 2017-04-18 DIAGNOSIS — M5416 Radiculopathy, lumbar region: Secondary | ICD-10-CM

## 2017-04-18 MED ORDER — GABAPENTIN 100 MG PO CAPS: 200.0000 mg | ORAL_CAPSULE | Freq: Every day | ORAL | 3 refills | Status: DC

## 2017-04-18 MED ORDER — PREDNISONE 50 MG PO TABS: 50.0000 mg | ORAL_TABLET | Freq: Every day | ORAL | 0 refills | Status: DC

## 2017-04-18 NOTE — Assessment & Plan Note (Signed)
Patient is having some lumbar radiculopathy.  Started on gabapentin well as well will do a short course of prednisone.  We discussed the possibility of formal physical therapy.  Discussed icing regimen.  Patient given home exercises and work with Product/process development scientist.  Follow-up with me again 4 weeks

## 2017-04-18 NOTE — Patient Instructions (Signed)
Good to see you  Ice 20 minutes 2 times daily. Usually after activity and before bed. Exercises 3 times a week.  Prednisone daily for 5 days but watch blood sugars, if go too high stop it and we will switch you to meloxicam for a short course.  Gabapentin 200mg  at night will help with the nerve pain.  See me again in 2 weeks to make sure all the way better

## 2017-04-19 DIAGNOSIS — I10 Essential (primary) hypertension: Secondary | ICD-10-CM | POA: Diagnosis not present

## 2017-04-19 DIAGNOSIS — E1151 Type 2 diabetes mellitus with diabetic peripheral angiopathy without gangrene: Secondary | ICD-10-CM | POA: Diagnosis not present

## 2017-04-19 DIAGNOSIS — Z1389 Encounter for screening for other disorder: Secondary | ICD-10-CM | POA: Diagnosis not present

## 2017-04-19 DIAGNOSIS — E7849 Other hyperlipidemia: Secondary | ICD-10-CM | POA: Diagnosis not present

## 2017-04-26 DIAGNOSIS — E291 Testicular hypofunction: Secondary | ICD-10-CM | POA: Diagnosis not present

## 2017-05-02 ENCOUNTER — Ambulatory Visit: Payer: 59 | Admitting: Family Medicine

## 2017-05-03 DIAGNOSIS — E291 Testicular hypofunction: Secondary | ICD-10-CM | POA: Diagnosis not present

## 2017-05-10 DIAGNOSIS — E291 Testicular hypofunction: Secondary | ICD-10-CM | POA: Diagnosis not present

## 2017-05-17 DIAGNOSIS — E291 Testicular hypofunction: Secondary | ICD-10-CM | POA: Diagnosis not present

## 2017-05-24 DIAGNOSIS — E291 Testicular hypofunction: Secondary | ICD-10-CM | POA: Diagnosis not present

## 2017-05-31 DIAGNOSIS — E291 Testicular hypofunction: Secondary | ICD-10-CM | POA: Diagnosis not present

## 2017-06-07 DIAGNOSIS — E291 Testicular hypofunction: Secondary | ICD-10-CM | POA: Diagnosis not present

## 2017-06-14 DIAGNOSIS — E291 Testicular hypofunction: Secondary | ICD-10-CM | POA: Diagnosis not present

## 2017-06-21 DIAGNOSIS — E291 Testicular hypofunction: Secondary | ICD-10-CM | POA: Diagnosis not present

## 2017-06-28 DIAGNOSIS — E291 Testicular hypofunction: Secondary | ICD-10-CM | POA: Diagnosis not present

## 2017-07-05 DIAGNOSIS — E291 Testicular hypofunction: Secondary | ICD-10-CM | POA: Diagnosis not present

## 2017-07-12 DIAGNOSIS — E291 Testicular hypofunction: Secondary | ICD-10-CM | POA: Diagnosis not present

## 2017-07-19 DIAGNOSIS — E291 Testicular hypofunction: Secondary | ICD-10-CM | POA: Diagnosis not present

## 2017-07-26 DIAGNOSIS — E291 Testicular hypofunction: Secondary | ICD-10-CM | POA: Diagnosis not present

## 2017-08-03 DIAGNOSIS — E291 Testicular hypofunction: Secondary | ICD-10-CM | POA: Diagnosis not present

## 2017-08-09 DIAGNOSIS — E291 Testicular hypofunction: Secondary | ICD-10-CM | POA: Diagnosis not present

## 2017-08-16 DIAGNOSIS — E291 Testicular hypofunction: Secondary | ICD-10-CM | POA: Diagnosis not present

## 2017-08-22 DIAGNOSIS — I10 Essential (primary) hypertension: Secondary | ICD-10-CM | POA: Diagnosis not present

## 2017-08-22 DIAGNOSIS — E1151 Type 2 diabetes mellitus with diabetic peripheral angiopathy without gangrene: Secondary | ICD-10-CM | POA: Diagnosis not present

## 2017-08-22 DIAGNOSIS — E7849 Other hyperlipidemia: Secondary | ICD-10-CM | POA: Diagnosis not present

## 2017-08-23 DIAGNOSIS — E291 Testicular hypofunction: Secondary | ICD-10-CM | POA: Diagnosis not present

## 2017-08-30 DIAGNOSIS — E291 Testicular hypofunction: Secondary | ICD-10-CM | POA: Diagnosis not present

## 2017-09-06 DIAGNOSIS — E291 Testicular hypofunction: Secondary | ICD-10-CM | POA: Diagnosis not present

## 2017-09-21 ENCOUNTER — Other Ambulatory Visit: Payer: Self-pay | Admitting: Internal Medicine

## 2017-09-21 DIAGNOSIS — G5700 Lesion of sciatic nerve, unspecified lower limb: Secondary | ICD-10-CM

## 2017-09-23 ENCOUNTER — Ambulatory Visit
Admission: RE | Admit: 2017-09-23 | Discharge: 2017-09-23 | Disposition: A | Discharge status: Home / Self Care | Payer: 59 | Source: Ambulatory Visit | Attending: Internal Medicine | Admitting: Internal Medicine

## 2017-09-23 DIAGNOSIS — M5117 Intervertebral disc disorders with radiculopathy, lumbosacral region: Secondary | ICD-10-CM | POA: Diagnosis not present

## 2017-09-23 DIAGNOSIS — G5700 Lesion of sciatic nerve, unspecified lower limb: Secondary | ICD-10-CM

## 2017-09-23 MED ORDER — GADOBENATE DIMEGLUMINE 529 MG/ML IV SOLN
20.0000 mL | Freq: Once | INTRAVENOUS | Status: AC | PRN
  Administered 2017-09-23: 20 mL via INTRAVENOUS

## 2017-09-26 DIAGNOSIS — E119 Type 2 diabetes mellitus without complications: Secondary | ICD-10-CM | POA: Diagnosis not present

## 2017-10-11 DIAGNOSIS — R29898 Other symptoms and signs involving the musculoskeletal system: Secondary | ICD-10-CM | POA: Diagnosis not present

## 2017-10-11 DIAGNOSIS — M546 Pain in thoracic spine: Secondary | ICD-10-CM | POA: Diagnosis not present

## 2017-10-11 DIAGNOSIS — M5416 Radiculopathy, lumbar region: Secondary | ICD-10-CM | POA: Diagnosis not present

## 2017-10-11 DIAGNOSIS — M5136 Other intervertebral disc degeneration, lumbar region: Secondary | ICD-10-CM | POA: Diagnosis not present

## 2017-10-19 ENCOUNTER — Other Ambulatory Visit: Payer: Self-pay | Admitting: Internal Medicine

## 2017-10-19 DIAGNOSIS — M25551 Pain in right hip: Secondary | ICD-10-CM

## 2017-10-29 DIAGNOSIS — M5380 Other specified dorsopathies, site unspecified: Secondary | ICD-10-CM | POA: Diagnosis not present

## 2017-10-29 DIAGNOSIS — Z794 Long term (current) use of insulin: Secondary | ICD-10-CM | POA: Diagnosis not present

## 2017-10-29 DIAGNOSIS — M25551 Pain in right hip: Secondary | ICD-10-CM | POA: Diagnosis not present

## 2017-11-04 ENCOUNTER — Other Ambulatory Visit: Payer: 59

## 2017-11-06 ENCOUNTER — Ambulatory Visit
Admission: RE | Admit: 2017-11-06 | Discharge: 2017-11-06 | Disposition: A | Discharge status: Home / Self Care | Payer: 59 | Source: Ambulatory Visit | Attending: Internal Medicine | Admitting: Internal Medicine

## 2017-11-06 DIAGNOSIS — M25551 Pain in right hip: Secondary | ICD-10-CM | POA: Diagnosis not present

## 2017-11-08 DIAGNOSIS — M25551 Pain in right hip: Secondary | ICD-10-CM | POA: Diagnosis not present

## 2017-11-08 DIAGNOSIS — M1611 Unilateral primary osteoarthritis, right hip: Secondary | ICD-10-CM | POA: Diagnosis not present

## 2017-11-28 DIAGNOSIS — M25551 Pain in right hip: Secondary | ICD-10-CM | POA: Diagnosis not present

## 2017-11-28 DIAGNOSIS — M1611 Unilateral primary osteoarthritis, right hip: Secondary | ICD-10-CM | POA: Diagnosis not present

## 2017-12-03 DIAGNOSIS — I10 Essential (primary) hypertension: Secondary | ICD-10-CM | POA: Diagnosis not present

## 2017-12-03 DIAGNOSIS — E1151 Type 2 diabetes mellitus with diabetic peripheral angiopathy without gangrene: Secondary | ICD-10-CM | POA: Diagnosis not present

## 2017-12-03 DIAGNOSIS — E7849 Other hyperlipidemia: Secondary | ICD-10-CM | POA: Diagnosis not present

## 2017-12-06 DIAGNOSIS — M5416 Radiculopathy, lumbar region: Secondary | ICD-10-CM | POA: Diagnosis not present

## 2017-12-12 ENCOUNTER — Encounter

## 2017-12-12 ENCOUNTER — Other Ambulatory Visit: Payer: Self-pay

## 2017-12-12 ENCOUNTER — Encounter: Payer: Self-pay | Admitting: Neurology

## 2017-12-12 ENCOUNTER — Ambulatory Visit: Payer: 59 | Admitting: Neurology

## 2017-12-12 VITALS — BP 141/70 | HR 80 | Ht 72.0 in | Wt 234.5 lb

## 2017-12-12 DIAGNOSIS — M79604 Pain in right leg: Secondary | ICD-10-CM

## 2017-12-12 MED ORDER — GABAPENTIN 300 MG PO CAPS: 300.0000 mg | ORAL_CAPSULE | Freq: Three times a day (TID) | ORAL | 11 refills | Status: DC

## 2017-12-12 NOTE — Progress Notes (Signed)
Reason for visit: Right leg pain  Referring physician: Dr. Eugenie Norrie is a 62 y.o. 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  The patient has a history of seizures following this, he remains on Keppra but his seizures has been quite well controlled.  The patient comes in today with a 69-month history of discomfort that began in the hip areas bilaterally, right greater than left with some pain radiating down into the anterior thighs bilaterally.  The patient underwent MRI of the lumbar spine which showed no evidence of spinal stenosis or evidence of significant nerve root impingement on either side.  The patient was seen by Dr. Sherwood Gambler, it was felt that the patient did not have any back issues as an etiology for his symptoms.  The patient does have a history of diabetes, he reports no sudden weight loss recently.  Two months ago, the patient was in a post office and he bent over to pick up something and then had difficulty getting back up and he fell backwards.  Since that time, he has had discomfort in the right knee and pain from the right knee down into the anterior lateral aspect of the right lower leg.  He did not have this pain before the fall.  He denied any swelling or bruising around the right knee.  The hip pain and right knee pain are worse with weightbearing, and much better when he is off of his feet but the right knee pain never completely disappears at this point.  The patient reports no true numbness of the extremities, he reports a feeling of weakness with the right leg with a tendency for the right knee to buckle.  He is wearing a knee brace.  The patient denies issues controlling the bowels or the bladder.  He denies neck pain or pain down the arms.  He is sent to this office for an evaluation.  Past Medical History:    Diagnosis Date  . Coronary artery disease   . Diabetes mellitus   . Hyperlipidemia   . Other iatrogenic hypothyroidism     Past Surgical History:  Procedure Laterality Date  . BRAIN SURGERY     2000  . rotater cuff surgery  01/17/2017    Family History  Problem Relation Age of Onset  . Heart attack Mother        age 22  . Heart attack Father        CABG    Social history:  reports that he has never smoked. He has never used smokeless tobacco. He reports that he drinks alcohol. He reports that he does not use drugs.  Medications:  Prior to Admission medications   Medication Sig Start Date End Date Taking? Authorizing Provider  clopidogrel (PLAVIX) 75 MG tablet Take 75 mg by mouth daily.   Yes [provider]  CRESTOR 10 MG tablet take 1 tablet by mouth at bedtime 07/27/14  Yes Fay Records, MD  gabapentin (NEURONTIN) 100 MG capsule Take 2 capsules (200 mg total) by mouth at bedtime. Patient taking differently: Take 300 mg by mouth 3 (three) times daily.  04/18/17  Yes Lyndal Pulley, DO  Insulin Glargine-Lixisenatide (SOLIQUA [place]) Inject 1 Dose into the skin daily.   Yes [provider]  levETIRAcetam (KEPPRA) 500 MG tablet Take 750 mg by mouth  every 12 (twelve) hours.    Yes [provider]  levothyroxine (SYNTHROID, LEVOTHROID) 137 MCG tablet Take 137 mcg by mouth daily before breakfast.   Yes [provider]  metFORMIN (GLUCOPHAGE) 500 MG tablet Take by mouth. 2 tabs twice a day   Yes [provider]  metoprolol succinate (TOPROL-XL) 25 MG 24 hr tablet Take 25 mg by mouth daily.     Yes [provider]     No Known Allergies  ROS:  Out of a complete 14 system review of symptoms, the patient complains only of the following symptoms, and all other reviewed systems are negative.  Joint pain Not enough sleep  Blood pressure (!) 141/70, pulse 80, height 6' (1.829 m), weight 234 lb 8 oz (106.4 kg), SpO2 98  %.  Physical Exam  General: The patient is alert and cooperative at the time of the examination.  The patient is moderately to markedly obese.  Eyes: Pupils are equal, round, and reactive to light. Discs are flat bilaterally.  Neck: The neck is supple, no carotid bruits are noted.  Respiratory: The respiratory examination is clear.  Cardiovascular: The cardiovascular examination reveals a regular rate and rhythm, no obvious murmurs or rubs are noted.  Neuromuscular: Internal and external rotation hips elicits some discomfort on the right, not on the left.  Skin: Extremities are without significant edema.  Neurologic Exam  Mental status: The patient is alert and oriented x 3 at the time of the examination. The patient has apparent normal recent and remote memory, with an apparently normal attention span and concentration ability.  Cranial nerves: Facial symmetry is present. There is good sensation of the face to pinprick and soft touch bilaterally. The strength of the facial muscles and the muscles to head turning and shoulder shrug are normal bilaterally. Speech is well enunciated, no aphasia or dysarthria is noted. Extraocular movements are full. Visual fields are full. The tongue is midline, and the patient has symmetric elevation of the soft palate. No obvious hearing deficits are noted.  Motor: The motor testing reveals 5 over 5 strength of all 4 extremities, with exception of slight weakness associated with discomfort with hip flexion on the right. Good symmetric motor tone is noted throughout.  Sensory: Sensory testing is intact to pinprick, soft touch, vibration sensation, and position sense on all 4 extremities, with exception of a stocking pattern pinprick sensory deficit across the ankles bilaterally. No evidence of extinction is noted.  Coordination: Cerebellar testing reveals good finger-nose-finger and heel-to-shin bilaterally.  Gait and station: Gait is normal. Tandem gait  is minimally unsteady. Romberg is negative. No drift is seen.  The patient is able to walk on the toes bilaterally, he has some difficulty walking on the heels on the right side more so than the left.  Reflexes: Deep tendon reflexes are symmetric and normal bilaterally, with exception of a slight depression of the left ankle jerk reflexes as compared to the right. Toes are downgoing bilaterally.   MRI lumbar 09/23/17:  IMPRESSION: 1. No acute osseous abnormality or malalignment. 2. Stable lumbar spondylosis with disc and facet degenerative changes greatest at the L4-5 level. 3. Mild left L3-4, mild bilateral L4-5, and mild left L5-S1 foraminal stenosis. No significant canal stenosis.  * MRI scan images were reviewed online. I agree with the written report.   MRI hips 11/06/17:  IMPRESSION: 1. Severe right labral degeneration with superior and anterior tear. 6.5 mm superior right paralabral cyst. Lobulated 11 x 16 mm  right inferior paralabral cyst. 2. Mild tendinosis of the left hamstring origin with a small partial-thickness tear. 3. Mild subchondral linear signal in the superior left femoral head which may reflect subchondral reactive marrow changes versus mild avascular necrosis.   Assessment/Plan:  1.  Right lower extremity pain  2.  Bilateral hip degenerative changes  3.  Right knee pain following fall  The patient does have diabetes, he is at risk for diabetic radiculitis or diabetic amyotrophy.  However, from the history and prior examination by MRI, the patient likely has bilateral hip disease resulting in his hip and anterior thigh pain.  His right knee discomfort came on after a fall 2 months ago, this was not present initially.  The patient may have injured his right knee with the fall.  It is quite possible that patient does not have a neuropathic cause of his discomfort.  The patient will be set up for nerve conduction study of both legs and EMG evaluation of the  right leg.  He will follow-up for the above evaluation.  Paul Alexanders MD 12/12/2017 9:36 AM  [facility] [address] [place], [place] [address]  Phone [phone] Fax [phone]

## 2017-12-13 DIAGNOSIS — E291 Testicular hypofunction: Secondary | ICD-10-CM | POA: Diagnosis not present

## 2017-12-20 DIAGNOSIS — E291 Testicular hypofunction: Secondary | ICD-10-CM | POA: Diagnosis not present

## 2017-12-24 DIAGNOSIS — Z0000 Encounter for general adult medical examination without abnormal findings: Secondary | ICD-10-CM | POA: Diagnosis not present

## 2017-12-24 DIAGNOSIS — Z125 Encounter for screening for malignant neoplasm of prostate: Secondary | ICD-10-CM | POA: Diagnosis not present

## 2017-12-24 DIAGNOSIS — E7849 Other hyperlipidemia: Secondary | ICD-10-CM | POA: Diagnosis not present

## 2017-12-24 DIAGNOSIS — R82998 Other abnormal findings in urine: Secondary | ICD-10-CM | POA: Diagnosis not present

## 2017-12-24 DIAGNOSIS — E038 Other specified hypothyroidism: Secondary | ICD-10-CM | POA: Diagnosis not present

## 2017-12-24 DIAGNOSIS — E1151 Type 2 diabetes mellitus with diabetic peripheral angiopathy without gangrene: Secondary | ICD-10-CM | POA: Diagnosis not present

## 2017-12-25 DIAGNOSIS — R29898 Other symptoms and signs involving the musculoskeletal system: Secondary | ICD-10-CM | POA: Diagnosis not present

## 2017-12-25 DIAGNOSIS — M792 Neuralgia and neuritis, unspecified: Secondary | ICD-10-CM | POA: Diagnosis not present

## 2017-12-25 DIAGNOSIS — M5416 Radiculopathy, lumbar region: Secondary | ICD-10-CM | POA: Diagnosis not present

## 2017-12-27 DIAGNOSIS — E291 Testicular hypofunction: Secondary | ICD-10-CM | POA: Diagnosis not present

## 2017-12-28 ENCOUNTER — Telehealth: Payer: Self-pay

## 2017-12-28 NOTE — Telephone Encounter (Signed)
-----   Message from Rodman Key, RN sent at 12/27/2017  5:50 PM EDT ----- Marykay Lex, Could someone call this place, [facility] -number below and ask them to send a clearance form instead of patient calling to ask.  That way once received a pre op clearance for pharmacy can be made. Thank you Michalene ----- Message ----- From: Gemma Payor D Sent: 12/27/2017   3:10 PM EDT To: Rodman Key, RN  Hi, Michalene  Pt called in asking for ok to stop Plavix x 7 days before he is schedule for injection in back on 01/10/18 @ 2:45pm.  And ok to restart 24 hours after.   [facility]              [address]              [place], [place] [address]               [phone]              [phone]  Call pt with questions, Thanks, Maudie Mercury

## 2017-12-28 NOTE — Telephone Encounter (Signed)
Left message with [facility] to fax clearance or call for more information.

## 2017-12-28 NOTE — Telephone Encounter (Signed)
-----   Message from Rodman Key, RN sent at 12/27/2017  5:50 PM EDT ----- Marykay Lex, Could someone call this place, [facility] -number below and ask them to send a clearance form instead of patient calling to ask.  That way once received a pre op clearance for pharmacy can be made. Thank you Michalene ----- Message ----- From: Gemma Payor D Sent: 12/27/2017   3:10 PM EDT To: Rodman Key, RN  Hi, Michalene  Pt called in asking for ok to stop Plavix x 7 days before he is schedule for injection in back on 01/10/18 @ 2:45pm.  And ok to restart 24 hours after.   [facility]              [address]              [place], [place] [address]               [phone]              [phone]  Call pt with questions, Thanks, Maudie Mercury

## 2017-12-31 ENCOUNTER — Telehealth: Payer: Self-pay

## 2017-12-31 ENCOUNTER — Telehealth: Payer: Self-pay | Admitting: Internal Medicine

## 2017-12-31 DIAGNOSIS — E1151 Type 2 diabetes mellitus with diabetic peripheral angiopathy without gangrene: Secondary | ICD-10-CM | POA: Diagnosis not present

## 2017-12-31 DIAGNOSIS — I10 Essential (primary) hypertension: Secondary | ICD-10-CM | POA: Diagnosis not present

## 2017-12-31 DIAGNOSIS — Z23 Encounter for immunization: Secondary | ICD-10-CM | POA: Diagnosis not present

## 2017-12-31 DIAGNOSIS — Z0000 Encounter for general adult medical examination without abnormal findings: Secondary | ICD-10-CM | POA: Diagnosis not present

## 2017-12-31 DIAGNOSIS — Z794 Long term (current) use of insulin: Secondary | ICD-10-CM | POA: Diagnosis not present

## 2017-12-31 NOTE — Telephone Encounter (Signed)
Pt calling back about clearance sent to [facility]. His injection is scheduled for 01/10/18. Please call

## 2017-12-31 NOTE — Telephone Encounter (Signed)
Called patient and made him aware that we still have not received the clearance request from [facility]. Made patient aware that I called over and took the information over the phone and will send clearance request to the Pre-Op Pool. Patient verbalized understanding and thanked me for the call.

## 2017-12-31 NOTE — Telephone Encounter (Signed)
Clearance req placed on 10/14

## 2017-12-31 NOTE — Telephone Encounter (Signed)
   [facility] Pre-operative Risk Assessment    Request for surgical clearance:  1. What type of surgery is being performed? Transforaminal Steroid Injection L4, L5/L5, S1   2. When is this surgery scheduled? 01/10/18   3. What type of clearance is required (medical clearance vs. Pharmacy clearance to hold med vs. Both)? Both  4. Are there any medications that need to be held prior to surgery and how long? Plavix 7 days prior and restart 24 hours after  5. Practice name and name of physician performing surgery? [facility]: Dr. Sheliah Mends  6. What is your office phone number? [phone]   7.   What is your office fax number? [phone] Attn: Felicia  8.   Anesthesia type (None, local, MAC, general)? Local

## 2017-12-31 NOTE — Telephone Encounter (Signed)
Patient seen in clinic in Jan 2019  Remote intervention Doing OK at time   If no changes, no CP he would be a low risk for major cardiac event   OK to proceed Hold Plavix 7 day prior  REsume after

## 2018-01-02 ENCOUNTER — Encounter: Payer: Self-pay | Admitting: Medical

## 2018-01-02 NOTE — Telephone Encounter (Signed)
I will remove call back pool, clearance letter recv'd.

## 2018-01-02 NOTE — Telephone Encounter (Signed)
Follow up   Felicia from [facility] is confirming the letter has been received.

## 2018-01-02 NOTE — Telephone Encounter (Signed)
   Primary Cardiologist: No primary care provider on file.  Chart reviewed as part of pre-operative protocol coverage.   He has a PMH of CAD s/p STEMI in 2007 with PCI to LCx, HLD, DM type 2. Patient was contacted 01/02/2018 in reference to pre-operative risk assessment for pending surgery as outlined below.  YUSUF YU was last seen on 03/23/2017 by Dr. Harrington Challenger.  Since that day, BRAM HOTTEL has done well from a cardiac standpoint. No complaints of chest pain, SOB, or DOE. He is limited in mobility by his back/hip/knee pain. He is on insulin for his diabetes which has needed to be increased recently.   RCRI score is 2 with a 6.6% risk of adverse cardiac event.   Therefore, based on ACC/AHA guidelines, the patient would be at acceptable risk for the planned procedure without further cardiovascular testing.   Per Dr. Harrington Challenger, okay to hold plavix for 7 days prior to procedure and resume once bleeding risk is acceptable by the surgery team.   I will route this recommendation to the requesting party via [facility] fax function and remove from pre-op pool.  Callback: - Please ensure clearance note is received by Dr. Evelene Croon' office  Abigail Butts, PA-C 01/02/2018, 1:22 PM

## 2018-01-02 NOTE — Telephone Encounter (Signed)
lmom to confirm if clearance letter was recv'd. I will remove from the call back pool.

## 2018-01-03 DIAGNOSIS — E291 Testicular hypofunction: Secondary | ICD-10-CM | POA: Diagnosis not present

## 2018-01-03 DIAGNOSIS — Z1212 Encounter for screening for malignant neoplasm of rectum: Secondary | ICD-10-CM | POA: Diagnosis not present

## 2018-01-08 ENCOUNTER — Ambulatory Visit: Payer: 59 | Admitting: Neurology

## 2018-01-08 ENCOUNTER — Ambulatory Visit (INDEPENDENT_AMBULATORY_CARE_PROVIDER_SITE_OTHER): Payer: 59 | Admitting: Neurology

## 2018-01-08 ENCOUNTER — Encounter: Payer: Self-pay | Admitting: Neurology

## 2018-01-08 DIAGNOSIS — E1144 Type 2 diabetes mellitus with diabetic amyotrophy: Secondary | ICD-10-CM

## 2018-01-08 DIAGNOSIS — M79604 Pain in right leg: Secondary | ICD-10-CM | POA: Diagnosis not present

## 2018-01-08 DIAGNOSIS — E538 Deficiency of other specified B group vitamins: Secondary | ICD-10-CM | POA: Diagnosis not present

## 2018-01-08 DIAGNOSIS — E0844 Diabetes mellitus due to underlying condition with diabetic amyotrophy: Secondary | ICD-10-CM

## 2018-01-08 DIAGNOSIS — E1142 Type 2 diabetes mellitus with diabetic polyneuropathy: Secondary | ICD-10-CM

## 2018-01-08 DIAGNOSIS — E1140 Type 2 diabetes mellitus with diabetic neuropathy, unspecified: Secondary | ICD-10-CM

## 2018-01-08 HISTORY — DX: Type 2 diabetes mellitus with diabetic neuropathy, unspecified: E11.40

## 2018-01-08 HISTORY — DX: Type 2 diabetes mellitus with diabetic amyotrophy: E11.44

## 2018-01-08 NOTE — Procedures (Signed)
     HISTORY:  [name] is a 62 year old gentleman with a history of diabetes and significant bilateral degenerative changes in the hips.  The patient reports some pain in both legs down in the thighs, he has weakness involving the right leg, he has fallen on occasion.  He is being evaluated for possible neuropathy or a radiculopathy.  NERVE CONDUCTION STUDIES:  Nerve conduction studies were performed on both lower extremities.  The distal motor latencies and motor amplitudes for the peroneal and posterior tibial nerves are normal bilaterally with normal nerve conduction velocities seen for these nerves.  The sensory latencies for the sural nerves are slightly prolonged on the left, normal on the right and prolonged for the peroneal nerves bilaterally.  The F-wave latencies for the posterior tibial nerves were within normal limits bilaterally.  EMG STUDIES:  EMG study was performed on the right lower extremity:  The tibialis anterior muscle reveals 2 to 4K motor units with full recruitment. 1+ positive waves were seen. The peroneus tertius muscle reveals 2 to 4K motor units with slightly reduced recruitment. No fibrillations or positive waves were seen. The medial gastrocnemius muscle reveals 1 to 3K motor units with full recruitment. No fibrillations or positive waves were seen. The vastus lateralis muscle reveals 2 to 4K motor units with decreased recruitment. 2+ positive waves were seen. The iliopsoas muscle reveals 2 to 4K motor units with decreased recruitment. 2+ positive waves were seen. The biceps femoris muscle (long head) reveals 2 to 4K motor units with full recruitment. No fibrillations or positive waves were seen. The lumbosacral paraspinal muscles were tested at 3 levels, and revealed no abnormalities of insertional activity at all 3 levels tested. There was good relaxation.   IMPRESSION:  Nerve conduction studies done on both lower extremities shows evidence of a mild  peripheral neuropathy, likely secondary to diabetes.  EMG of the right lower extremity shows denervation of the thigh muscles and tibialis anterior muscle consistent with the diagnosis of a diabetic amyotrophy.  Clinical correlation is required.  Jill Alexanders MD 01/08/2018 1:22 PM  [facility] [address] [place], [place] [address]  Phone [phone] Fax [phone]

## 2018-01-08 NOTE — Progress Notes (Addendum)
The patient comes in today for EMG nerve conduction study evaluation.  The nerve conduction study shows a mild diabetic peripheral neuropathy, EMG of the right leg shows findings that are most consistent with a diabetic amyotrophy.  This is the likely source of weakness with hip flexion and knee extension, this is the source of risk for falls.  Conservative measures will be undertaken, we would expect improvement in the leg weakness within the next 12 months.  Blood work will be done today.      [place]    Nerve / Sites Muscle Latency Ref. Amplitude Ref. Rel Amp Segments Distance Velocity Ref. Area    ms ms mV mV %  cm m/s m/s mVms  R Peroneal - EDB     Ankle EDB 4.6 ?6.5 2.8 ?2.0 100 Ankle - EDB 9   8.2     Fib head EDB 12.0  2.2  78.3 Fib head - Ankle 33 45 ?44 6.9     Pop fossa EDB 14.6  1.8  83 Pop fossa - Fib head 12 45 ?44 9.4     Acc Peron EDB 8.5  1.0  56.6 Pop fossa - Ankle    2.1         Acc Peron - Pop fossa      L Peroneal - EDB     Ankle EDB 4.9 ?6.5 7.3 ?2.0 100 Ankle - EDB 9   18.8     Fib head EDB 12.4  5.3  73.2 Fib head - Ankle 33 44 ?44 17.0     Pop fossa EDB 15.1  4.3  80.1 Pop fossa - Fib head 12 44 ?44 13.9     Acc Peron EDB 7.8  2.1  48.2 Pop fossa - Ankle    4.4         Acc Peron - Pop fossa      R Tibial - AH     Ankle AH 5.3 ?5.8 14.5 ?4.0 100 Ankle - AH 9   31.4     Pop fossa AH 15.6  10.1  69.7 Pop fossa - Ankle 42 41 ?41 27.3  L Tibial - AH     Ankle AH 5.0 ?5.8 20.3 ?4.0 100 Ankle - AH 9   50.5     Pop fossa AH 15.0  15.3  75.6 Pop fossa - Ankle 42 42 ?41 42.3             SNC    Nerve / Sites Rec. Site Peak Lat Ref.  Amp Ref. Segments Distance    ms ms V V  cm  R Sural - Ankle (Calf)     Calf Ankle 4.1 ?4.4 2 ?6 Calf - Ankle 14  L Sural - Ankle (Calf)     Calf Ankle 4.9 ?4.4 2 ?6 Calf - Ankle 14  R Superficial peroneal - Ankle     Lat leg Ankle 4.6 ?4.4 2 ?6 Lat leg - Ankle 14  L Superficial peroneal - Ankle     Lat leg Ankle 4.5 ?4.4 1 ?6 Lat leg  - Ankle 14             F  Wave    Nerve F Lat Ref.   ms ms  R Tibial - AH 55.3 ?56.0  L Tibial - AH 55.9 ?56.0         EMG full

## 2018-01-08 NOTE — Progress Notes (Signed)
Please refer to EMG and nerve conduction procedure note.  

## 2018-01-10 DIAGNOSIS — M5416 Radiculopathy, lumbar region: Secondary | ICD-10-CM | POA: Diagnosis not present

## 2018-01-12 LAB — SEDIMENTATION RATE: SED RATE: 12 mm/h (ref 0–30)

## 2018-01-12 LAB — MULTIPLE MYELOMA PANEL, SERUM
ALBUMIN/GLOB SERPL: 1.1 (ref 0.7–1.7)
ALPHA 1: 0.3 g/dL (ref 0.0–0.4)
ALPHA2 GLOB SERPL ELPH-MCNC: 1 g/dL (ref 0.4–1.0)
Albumin SerPl Elph-Mcnc: 3.5 g/dL (ref 2.9–4.4)
B-Globulin SerPl Elph-Mcnc: 1 g/dL (ref 0.7–1.3)
GAMMA GLOB SERPL ELPH-MCNC: 1.1 g/dL (ref 0.4–1.8)
Globulin, Total: 3.3 g/dL (ref 2.2–3.9)
IGG (IMMUNOGLOBIN G), SERUM: 1100 mg/dL (ref 700–1600)
IGM (IMMUNOGLOBULIN M), SRM: 134 mg/dL (ref 20–172)
IgA/Immunoglobulin A, Serum: 171 mg/dL (ref 61–437)
Total Protein: 6.8 g/dL (ref 6.0–8.5)

## 2018-01-12 LAB — PAN-ANCA
ANCA Proteinase 3: <3.5 U/mL (ref 0.0–3.5)
C-ANCA: <1:20 {titer}
Myeloperoxidase Ab: <9 U/mL (ref 0.0–9.0)
P-ANCA: <1:20 {titer}

## 2018-01-12 LAB — B. BURGDORFI ANTIBODIES: Lyme IgG/IgM Ab: <0.91 {ISR} (ref 0.00–0.90)

## 2018-01-12 LAB — RHEUMATOID FACTOR: Rhuematoid fact SerPl-aCnc: <10 IU/mL (ref 0.0–13.9)

## 2018-01-12 LAB — ANGIOTENSIN CONVERTING ENZYME: Angio Convert Enzyme: 50 U/L (ref 14–82)

## 2018-01-12 LAB — VITAMIN B12: Vitamin B-12: 169 pg/mL — ABNORMAL LOW (ref 232–1245)

## 2018-01-12 LAB — ANA W/REFLEX: Anti Nuclear Antibody(ANA): NEGATIVE

## 2018-01-13 ENCOUNTER — Telehealth: Payer: Self-pay | Admitting: Neurology

## 2018-01-13 NOTE — Telephone Encounter (Signed)
I called the patient.  The vitamin B12 level was low, otherwise the blood work was unremarkable.  I will have the patient come in for a B12 injection, then he will start vitamin B12 orally taking 1000 mcg daily.  We will follow the vitamin B12 levels in the future.

## 2018-01-14 ENCOUNTER — Ambulatory Visit (INDEPENDENT_AMBULATORY_CARE_PROVIDER_SITE_OTHER): Payer: 59

## 2018-01-14 DIAGNOSIS — E538 Deficiency of other specified B group vitamins: Secondary | ICD-10-CM | POA: Diagnosis not present

## 2018-01-14 MED ORDER — CYANOCOBALAMIN 1000 MCG/ML IJ SOLN
1000.0000 ug | Freq: Once | INTRAMUSCULAR | Status: AC
  Administered 2018-01-14: 1000 ug via INTRAMUSCULAR

## 2018-01-14 NOTE — Telephone Encounter (Signed)
Spoke with pt. He will come in at 1115 this am for inj.  Added to nurse sched./fim

## 2018-01-14 NOTE — Progress Notes (Signed)
Pt presented today for a B12 injection. B12 1000 mcg/mL given IM into pt's L deltoid. Aseptic technique use. Pt tolerated injection well and left in NAD.

## 2018-01-17 DIAGNOSIS — E291 Testicular hypofunction: Secondary | ICD-10-CM | POA: Diagnosis not present

## 2018-01-24 DIAGNOSIS — E291 Testicular hypofunction: Secondary | ICD-10-CM | POA: Diagnosis not present

## 2018-01-31 DIAGNOSIS — E291 Testicular hypofunction: Secondary | ICD-10-CM | POA: Diagnosis not present

## 2018-02-07 DIAGNOSIS — E291 Testicular hypofunction: Secondary | ICD-10-CM | POA: Diagnosis not present

## 2018-02-11 DIAGNOSIS — R29898 Other symptoms and signs involving the musculoskeletal system: Secondary | ICD-10-CM | POA: Diagnosis not present

## 2018-02-11 DIAGNOSIS — M792 Neuralgia and neuritis, unspecified: Secondary | ICD-10-CM | POA: Diagnosis not present

## 2018-02-11 DIAGNOSIS — M5416 Radiculopathy, lumbar region: Secondary | ICD-10-CM | POA: Diagnosis not present

## 2018-02-21 DIAGNOSIS — E291 Testicular hypofunction: Secondary | ICD-10-CM | POA: Diagnosis not present

## 2018-02-21 DIAGNOSIS — Z125 Encounter for screening for malignant neoplasm of prostate: Secondary | ICD-10-CM | POA: Diagnosis not present

## 2018-02-22 DIAGNOSIS — M6281 Muscle weakness (generalized): Secondary | ICD-10-CM | POA: Diagnosis not present

## 2018-02-22 DIAGNOSIS — R262 Difficulty in walking, not elsewhere classified: Secondary | ICD-10-CM | POA: Diagnosis not present

## 2018-02-22 DIAGNOSIS — R29898 Other symptoms and signs involving the musculoskeletal system: Secondary | ICD-10-CM | POA: Diagnosis not present

## 2018-02-27 DIAGNOSIS — R262 Difficulty in walking, not elsewhere classified: Secondary | ICD-10-CM | POA: Diagnosis not present

## 2018-02-27 DIAGNOSIS — M6281 Muscle weakness (generalized): Secondary | ICD-10-CM | POA: Diagnosis not present

## 2018-02-27 DIAGNOSIS — R29898 Other symptoms and signs involving the musculoskeletal system: Secondary | ICD-10-CM | POA: Diagnosis not present

## 2018-02-28 DIAGNOSIS — E291 Testicular hypofunction: Secondary | ICD-10-CM | POA: Diagnosis not present

## 2018-03-01 DIAGNOSIS — R29898 Other symptoms and signs involving the musculoskeletal system: Secondary | ICD-10-CM | POA: Diagnosis not present

## 2018-03-01 DIAGNOSIS — M6281 Muscle weakness (generalized): Secondary | ICD-10-CM | POA: Diagnosis not present

## 2018-03-01 DIAGNOSIS — R262 Difficulty in walking, not elsewhere classified: Secondary | ICD-10-CM | POA: Diagnosis not present

## 2018-03-07 DIAGNOSIS — E291 Testicular hypofunction: Secondary | ICD-10-CM | POA: Diagnosis not present

## 2018-03-08 DIAGNOSIS — R29898 Other symptoms and signs involving the musculoskeletal system: Secondary | ICD-10-CM | POA: Diagnosis not present

## 2018-03-08 DIAGNOSIS — M6281 Muscle weakness (generalized): Secondary | ICD-10-CM | POA: Diagnosis not present

## 2018-03-08 DIAGNOSIS — R262 Difficulty in walking, not elsewhere classified: Secondary | ICD-10-CM | POA: Diagnosis not present

## 2018-03-11 DIAGNOSIS — R29898 Other symptoms and signs involving the musculoskeletal system: Secondary | ICD-10-CM | POA: Diagnosis not present

## 2018-03-11 DIAGNOSIS — R262 Difficulty in walking, not elsewhere classified: Secondary | ICD-10-CM | POA: Diagnosis not present

## 2018-03-11 DIAGNOSIS — M6281 Muscle weakness (generalized): Secondary | ICD-10-CM | POA: Diagnosis not present

## 2018-03-14 DIAGNOSIS — M6281 Muscle weakness (generalized): Secondary | ICD-10-CM | POA: Diagnosis not present

## 2018-03-14 DIAGNOSIS — R29898 Other symptoms and signs involving the musculoskeletal system: Secondary | ICD-10-CM | POA: Diagnosis not present

## 2018-03-14 DIAGNOSIS — R262 Difficulty in walking, not elsewhere classified: Secondary | ICD-10-CM | POA: Diagnosis not present

## 2018-03-14 DIAGNOSIS — E291 Testicular hypofunction: Secondary | ICD-10-CM | POA: Diagnosis not present

## 2018-03-18 DIAGNOSIS — R29898 Other symptoms and signs involving the musculoskeletal system: Secondary | ICD-10-CM | POA: Diagnosis not present

## 2018-03-18 DIAGNOSIS — M6281 Muscle weakness (generalized): Secondary | ICD-10-CM | POA: Diagnosis not present

## 2018-03-18 DIAGNOSIS — R262 Difficulty in walking, not elsewhere classified: Secondary | ICD-10-CM | POA: Diagnosis not present

## 2018-03-21 ENCOUNTER — Encounter: Payer: Self-pay | Admitting: Internal Medicine

## 2018-03-21 DIAGNOSIS — E291 Testicular hypofunction: Secondary | ICD-10-CM | POA: Diagnosis not present

## 2018-03-22 DIAGNOSIS — M6281 Muscle weakness (generalized): Secondary | ICD-10-CM | POA: Diagnosis not present

## 2018-03-22 DIAGNOSIS — R29898 Other symptoms and signs involving the musculoskeletal system: Secondary | ICD-10-CM | POA: Diagnosis not present

## 2018-03-22 DIAGNOSIS — R262 Difficulty in walking, not elsewhere classified: Secondary | ICD-10-CM | POA: Diagnosis not present

## 2018-04-09 DIAGNOSIS — M6281 Muscle weakness (generalized): Secondary | ICD-10-CM | POA: Diagnosis not present

## 2018-04-09 DIAGNOSIS — R262 Difficulty in walking, not elsewhere classified: Secondary | ICD-10-CM | POA: Diagnosis not present

## 2018-04-09 DIAGNOSIS — R29898 Other symptoms and signs involving the musculoskeletal system: Secondary | ICD-10-CM | POA: Diagnosis not present

## 2018-04-11 DIAGNOSIS — E291 Testicular hypofunction: Secondary | ICD-10-CM | POA: Diagnosis not present

## 2018-04-11 DIAGNOSIS — M6281 Muscle weakness (generalized): Secondary | ICD-10-CM | POA: Diagnosis not present

## 2018-04-11 DIAGNOSIS — R29898 Other symptoms and signs involving the musculoskeletal system: Secondary | ICD-10-CM | POA: Diagnosis not present

## 2018-04-11 DIAGNOSIS — R262 Difficulty in walking, not elsewhere classified: Secondary | ICD-10-CM | POA: Diagnosis not present

## 2018-04-15 ENCOUNTER — Encounter (INDEPENDENT_AMBULATORY_CARE_PROVIDER_SITE_OTHER): Payer: Self-pay

## 2018-04-15 ENCOUNTER — Ambulatory Visit: Payer: 59 | Admitting: Internal Medicine

## 2018-04-15 ENCOUNTER — Encounter: Payer: Self-pay | Admitting: Internal Medicine

## 2018-04-15 VITALS — BP 122/72 | HR 80 | Ht 72.0 in | Wt 235.8 lb

## 2018-04-15 DIAGNOSIS — I10 Essential (primary) hypertension: Secondary | ICD-10-CM | POA: Diagnosis not present

## 2018-04-15 DIAGNOSIS — I2510 Atherosclerotic heart disease of native coronary artery without angina pectoris: Secondary | ICD-10-CM | POA: Diagnosis not present

## 2018-04-15 DIAGNOSIS — E782 Mixed hyperlipidemia: Secondary | ICD-10-CM

## 2018-04-15 NOTE — Patient Instructions (Signed)
Medication Instructions:  No change If you need a refill on your cardiac medications before your next appointment, please call your pharmacy.   Lab work: none If you have labs (blood work) drawn today and your tests are completely normal, you will receive your results only by: Marland Kitchen MyChart Message (if you have MyChart) OR . A paper copy in the mail If you have any lab test that is abnormal or we need to change your treatment, we will call you to review the results.  Testing/Procedures: none  Follow-Up: At [facility], you and your health needs are our priority.  As part of our continuing mission to provide you with exceptional heart care, we have created designated Provider Care Teams.  These Care Teams include your primary Cardiologist (physician) and Advanced Practice Providers (APPs -  Physician Assistants and Nurse Practitioners) who all work together to provide you with the care you need, when you need it. You will need a follow up appointment in:  12 months.  Please call our office 2 months in advance to schedule this appointment.  You may see Dorris Carnes, MD or one of the following Advanced Practice Providers on your designated Care Team: Richardson Dopp, PA-C [place], [place] . Daune Perch, NP  Any Other Special Instructions Will Be Listed Below (If Applicable).

## 2018-04-15 NOTE — Progress Notes (Signed)
Cardiology Office Note   Date:  04/15/2018   ID:  Paul, Oconnor May 23, 1955, MRN [id number]  PCP:  Burnard Bunting, MD  Cardiologist:   Dorris Carnes, MD    F?U of CAD      History of Present Illness: [name] is a 63 y.o. male with a history of CAD, HL, DM, pericardial cyst   He is status post ST elevation MI in April 2007 has 3 bare-metal stents to the circumflex. Repeat catheterization 2009 showed a 60-65% mid RCA stenosis; small RV branch with 80% stenosis. D2 had a 70% ostial lesion. D1 40%. The stents in the LCX had 50% instent proximally.LVEF 55% overall unchanged.   I saw the pt in Jan 2019     Since seen lpids in Oct 2019 LDL is 60   HDL 32    The pt says his breathing is OK   No CP  He does complain of some fatigue  Blames on LE problems ( R leg)   Going through rehab  Outpatient Medications Prior to Visit  Medication Sig Dispense Refill  . clopidogrel (PLAVIX) 75 MG tablet Take 75 mg by mouth daily.    . CRESTOR 10 MG tablet take 1 tablet by mouth at bedtime 30 tablet 3  . gabapentin (NEURONTIN) 300 MG capsule Take 1 capsule (300 mg total) by mouth 3 (three) times daily. 90 capsule 11  . Insulin Glargine-Lixisenatide (SOLIQUA [place]) Inject 1 Dose into the skin daily.    Marland Kitchen levETIRAcetam (KEPPRA) 500 MG tablet Take 750 mg by mouth every 12 (twelve) hours.     Marland Kitchen levothyroxine (SYNTHROID, LEVOTHROID) 137 MCG tablet Take 137 mcg by mouth daily before breakfast.    . metFORMIN (GLUCOPHAGE) 500 MG tablet Take by mouth. 2 tabs twice a day    . metoprolol succinate (TOPROL-XL) 25 MG 24 hr tablet Take 25 mg by mouth daily.       No facility-administered medications prior to visit.      Allergies:   Patient has no known allergies.   Past Medical History:  Diagnosis Date  . Coronary artery disease   . Diabetes mellitus   . 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 Diabetic neuropathy ([place]) 01/08/2018  . Hyperlipidemia   . Other iatrogenic hypothyroidism      Past Surgical History:  Procedure Laterality Date  . BRAIN SURGERY     2000  . rotater cuff surgery  01/17/2017     Social History:  The patient  reports that he has never smoked. He has never used smokeless tobacco. He reports current alcohol use. He reports that he does not use drugs.   Family History:  The patient's family history includes Heart attack in his father and mother.    ROS:  Please see the history of present illness. All other systems are reviewed and  Negative to the above problem except as noted.    PHYSICAL EXAM: VS:  BP 122/72   Pulse 80   Ht 6' (1.829 m)   Wt 235 lb 12.8 oz (107 kg)   BMI 31.98 kg/m   GEN: Obese 63 yo  in no acute distress  HEENT: normal  Neck: NEck Full  No obevious JVD  , carotid bruits, or masses Cardiac: RRR; no murmurs, rubs, or gallops,no edema  Respiratory:  clear to auscultation bilaterally, normal work of breathing GI: soft, nontender, nondistended, + BS  No hepatomegaly  MS: no deformity Moving all extremities  Skin: warm and dry, no rash Neuro:  Strength and sensation are intact Psych: euthymic mood, full affect   EKG:  EKG is  ordered today.  SR 80 bpm  IWMI     Lipid Panel    Component Value Date/Time   CHOL 104 04/19/2009 0837   TRIG 152.0 (H) 04/19/2009 0837   HDL 31.40 (L) 04/19/2009 0837   CHOLHDL 3 04/19/2009 0837   VLDL 30.4 04/19/2009 0837   LDLCALC 42 04/19/2009 0837      Wt Readings from Last 3 Encounters:  04/15/18 235 lb 12.8 oz (107 kg)  12/12/17 234 lb 8 oz (106.4 kg)  04/18/17 234 lb (106.1 kg)      ASSESSMENT AND PLAN:  1  CAD   No symptoms of angina  Go ahead and keep on ASA and Plavix    2  HL  Continue Crestor   LDL is very good    3  HTN  BP is good    4   Ortho  Seeing group at [facility]    F/U in 1 year    Current medicines are reviewed at length with the patient today.  The patient does not have concerns regarding medicines.  Signed, Dorris Carnes, MD  04/15/2018 9:48 AM     [address], [place], [place]  [address] Phone: [phone]; Fax: [phone]

## 2018-04-18 DIAGNOSIS — E291 Testicular hypofunction: Secondary | ICD-10-CM | POA: Diagnosis not present

## 2018-04-19 DIAGNOSIS — R29898 Other symptoms and signs involving the musculoskeletal system: Secondary | ICD-10-CM | POA: Diagnosis not present

## 2018-04-19 DIAGNOSIS — M6281 Muscle weakness (generalized): Secondary | ICD-10-CM | POA: Diagnosis not present

## 2018-04-19 DIAGNOSIS — R262 Difficulty in walking, not elsewhere classified: Secondary | ICD-10-CM | POA: Diagnosis not present

## 2018-05-01 DIAGNOSIS — R262 Difficulty in walking, not elsewhere classified: Secondary | ICD-10-CM | POA: Diagnosis not present

## 2018-05-01 DIAGNOSIS — R29898 Other symptoms and signs involving the musculoskeletal system: Secondary | ICD-10-CM | POA: Diagnosis not present

## 2018-05-01 DIAGNOSIS — M6281 Muscle weakness (generalized): Secondary | ICD-10-CM | POA: Diagnosis not present

## 2018-05-02 DIAGNOSIS — E291 Testicular hypofunction: Secondary | ICD-10-CM | POA: Diagnosis not present

## 2018-05-08 DIAGNOSIS — M6281 Muscle weakness (generalized): Secondary | ICD-10-CM | POA: Diagnosis not present

## 2018-05-08 DIAGNOSIS — R262 Difficulty in walking, not elsewhere classified: Secondary | ICD-10-CM | POA: Diagnosis not present

## 2018-05-08 DIAGNOSIS — R29898 Other symptoms and signs involving the musculoskeletal system: Secondary | ICD-10-CM | POA: Diagnosis not present

## 2018-05-09 DIAGNOSIS — E291 Testicular hypofunction: Secondary | ICD-10-CM | POA: Diagnosis not present

## 2018-05-10 DIAGNOSIS — R262 Difficulty in walking, not elsewhere classified: Secondary | ICD-10-CM | POA: Diagnosis not present

## 2018-05-10 DIAGNOSIS — M6281 Muscle weakness (generalized): Secondary | ICD-10-CM | POA: Diagnosis not present

## 2018-05-10 DIAGNOSIS — R29898 Other symptoms and signs involving the musculoskeletal system: Secondary | ICD-10-CM | POA: Diagnosis not present

## 2018-05-13 DIAGNOSIS — R29898 Other symptoms and signs involving the musculoskeletal system: Secondary | ICD-10-CM | POA: Diagnosis not present

## 2018-05-13 DIAGNOSIS — R262 Difficulty in walking, not elsewhere classified: Secondary | ICD-10-CM | POA: Diagnosis not present

## 2018-05-13 DIAGNOSIS — M6281 Muscle weakness (generalized): Secondary | ICD-10-CM | POA: Diagnosis not present

## 2018-05-16 DIAGNOSIS — E291 Testicular hypofunction: Secondary | ICD-10-CM | POA: Diagnosis not present

## 2018-05-22 DIAGNOSIS — M6281 Muscle weakness (generalized): Secondary | ICD-10-CM | POA: Diagnosis not present

## 2018-05-22 DIAGNOSIS — E1151 Type 2 diabetes mellitus with diabetic peripheral angiopathy without gangrene: Secondary | ICD-10-CM | POA: Diagnosis not present

## 2018-05-22 DIAGNOSIS — R262 Difficulty in walking, not elsewhere classified: Secondary | ICD-10-CM | POA: Diagnosis not present

## 2018-05-22 DIAGNOSIS — I10 Essential (primary) hypertension: Secondary | ICD-10-CM | POA: Diagnosis not present

## 2018-05-22 DIAGNOSIS — R29898 Other symptoms and signs involving the musculoskeletal system: Secondary | ICD-10-CM | POA: Diagnosis not present

## 2018-05-22 DIAGNOSIS — Z794 Long term (current) use of insulin: Secondary | ICD-10-CM | POA: Diagnosis not present

## 2018-05-23 DIAGNOSIS — E291 Testicular hypofunction: Secondary | ICD-10-CM | POA: Diagnosis not present

## 2018-05-24 ENCOUNTER — Telehealth: Payer: Self-pay | Admitting: Internal Medicine

## 2018-05-24 DIAGNOSIS — M5416 Radiculopathy, lumbar region: Secondary | ICD-10-CM | POA: Diagnosis not present

## 2018-05-24 NOTE — Telephone Encounter (Signed)
Error  Patient will have office call fro Preop clearance.

## 2018-05-28 NOTE — Telephone Encounter (Signed)
[name] from [facility] called. The pt is having a Transforaminal Steroid Injection of L4,5-5S1 on 03/18 done by Dr. Evelene Croon. The Pt needs to be off Plavix for 5 days before the procedure. He has had a procedure done by this Dr. In the past. Please contact Paul if the pt is not cleared for the procedure, so they can reschedule

## 2018-05-28 NOTE — Telephone Encounter (Signed)
   Primary Cardiologist: Dorris Carnes, MD  Chart reviewed as part of pre-operative protocol coverage. Patient was contacted 05/28/2018 in reference to pre-operative risk assessment for pending surgery as outlined below.  [name] was last seen on 04/15/18  by Dr. Harrington Challenger.  Since that day, [name] has done well with his hx of CAD and last stents in 2007.  Pt may hold plavix for 5 days as requested and resume after procedure as soon as safe.    Therefore, based on ACC/AHA guidelines, the patient would be at acceptable risk for the planned procedure without further cardiovascular testing.   I will route this recommendation to the requesting party via Epic fax function and remove from pre-op pool.  Please call with questions.  Cecilie Kicks, NP 05/28/2018, 4:29 PM

## 2018-05-28 NOTE — Telephone Encounter (Signed)
   [facility] Pre-operative Risk Assessment    Request for surgical clearance:  1. What type of surgery is being performed? Transforaminal Steroid Injection of L4,5-5S1   2. When is this surgery scheduled? 06/05/18  3. What type of clearance is required (medical clearance vs. Pharmacy clearance to hold med vs. Both)? Pharmacy clearance  4. Are there any medications that need to be held prior to surgery and how long? Pt needs to be off Plavix for 5 days before the procedure.   5. Practice name and name of physician performing surgery? [facility], Dr. Evelene Croon   6. What is your office phone number  [phone]   7.   What is your office fax number [phone]  8.   Anesthesia type (None, local, MAC, general) ? None listed   Jacinta Shoe 05/28/2018, 4:17 PM  _________________________________________________________________   (provider comments below)

## 2018-05-30 DIAGNOSIS — E291 Testicular hypofunction: Secondary | ICD-10-CM | POA: Diagnosis not present

## 2018-06-05 DIAGNOSIS — M5416 Radiculopathy, lumbar region: Secondary | ICD-10-CM | POA: Diagnosis not present

## 2018-06-13 DIAGNOSIS — E291 Testicular hypofunction: Secondary | ICD-10-CM | POA: Diagnosis not present

## 2018-06-20 DIAGNOSIS — E291 Testicular hypofunction: Secondary | ICD-10-CM | POA: Diagnosis not present

## 2018-06-27 DIAGNOSIS — E291 Testicular hypofunction: Secondary | ICD-10-CM | POA: Diagnosis not present

## 2018-07-04 DIAGNOSIS — E291 Testicular hypofunction: Secondary | ICD-10-CM | POA: Diagnosis not present

## 2018-07-11 ENCOUNTER — Telehealth: Payer: Self-pay | Admitting: Internal Medicine

## 2018-07-11 DIAGNOSIS — E291 Testicular hypofunction: Secondary | ICD-10-CM | POA: Diagnosis not present

## 2018-07-11 NOTE — Telephone Encounter (Signed)
Dr. Harrington Challenger, Please comment on holding plavix for 7-8 days for back injection.

## 2018-07-11 NOTE — Telephone Encounter (Signed)
OK to hold 7 days prior to back injection   Resume after when safe

## 2018-07-11 NOTE — Telephone Encounter (Signed)
New Message -   [facility] Pre-operative Risk Assessment    Request for surgical clearance:  1. What type of surgery is being performed? Lumbar transforaminal epidural steroid injuecitoin   2. When is this surgery scheduled? 07/19/2018  3. What type of clearance is required (medical clearance vs. Pharmacy clearance to hold med vs. Both)? Pharmacy  4. Are there any medications that need to be held prior to surgery and how long?Plavix 7-8 days  5. Practice name and name of physician performing surgery? Dr. Junious Silk [facility]  6. What is your office phone number [phone]   7.   What is your office fax number [phone]  8.   Anesthesia type (None, local, MAC, general) ? none   Porfirio Mylar 07/11/2018, 10:51 AM  ________________________________________________________________

## 2018-07-12 NOTE — Telephone Encounter (Addendum)
    Dr Harrington Challenger has cleared Mr Madani to hold the Plavix for 7 days prior to injections.   Dr. Junious Silk [facility]  office phone number [phone] office fax number [phone]  Rosaria Ferries, PA-C 07/12/2018 9:32 AM Beeper [phone]'

## 2018-07-18 DIAGNOSIS — E291 Testicular hypofunction: Secondary | ICD-10-CM | POA: Diagnosis not present

## 2018-07-25 DIAGNOSIS — E291 Testicular hypofunction: Secondary | ICD-10-CM | POA: Diagnosis not present

## 2018-07-26 DIAGNOSIS — M47817 Spondylosis without myelopathy or radiculopathy, lumbosacral region: Secondary | ICD-10-CM | POA: Diagnosis not present

## 2018-07-26 DIAGNOSIS — M5416 Radiculopathy, lumbar region: Secondary | ICD-10-CM | POA: Diagnosis not present

## 2018-08-01 DIAGNOSIS — E291 Testicular hypofunction: Secondary | ICD-10-CM | POA: Diagnosis not present

## 2018-08-07 DIAGNOSIS — R079 Chest pain, unspecified: Secondary | ICD-10-CM | POA: Diagnosis not present

## 2018-08-07 DIAGNOSIS — I2510 Atherosclerotic heart disease of native coronary artery without angina pectoris: Secondary | ICD-10-CM | POA: Diagnosis not present

## 2018-08-07 DIAGNOSIS — R071 Chest pain on breathing: Secondary | ICD-10-CM | POA: Diagnosis not present

## 2018-08-07 DIAGNOSIS — M5380 Other specified dorsopathies, site unspecified: Secondary | ICD-10-CM | POA: Diagnosis not present

## 2018-08-07 DIAGNOSIS — I10 Essential (primary) hypertension: Secondary | ICD-10-CM | POA: Diagnosis not present

## 2018-08-08 ENCOUNTER — Other Ambulatory Visit: Payer: Self-pay | Admitting: Internal Medicine

## 2018-08-08 ENCOUNTER — Other Ambulatory Visit: Payer: Self-pay

## 2018-08-08 ENCOUNTER — Ambulatory Visit
Admission: RE | Admit: 2018-08-08 | Discharge: 2018-08-08 | Disposition: A | Discharge status: Home / Self Care | Payer: 59 | Source: Ambulatory Visit | Attending: Internal Medicine | Admitting: Internal Medicine

## 2018-08-08 DIAGNOSIS — R0781 Pleurodynia: Secondary | ICD-10-CM

## 2018-08-08 DIAGNOSIS — R079 Chest pain, unspecified: Secondary | ICD-10-CM

## 2018-08-08 DIAGNOSIS — E291 Testicular hypofunction: Secondary | ICD-10-CM | POA: Diagnosis not present

## 2018-08-08 MED ORDER — IOPAMIDOL (ISOVUE-370) INJECTION 76%
75.0000 mL | Freq: Once | INTRAVENOUS | Status: AC | PRN
  Administered 2018-08-08: 75 mL via INTRAVENOUS

## 2018-09-06 ENCOUNTER — Telehealth: Payer: Self-pay | Admitting: *Deleted

## 2018-09-06 NOTE — Telephone Encounter (Signed)
   [facility] Pre-operative Risk Assessment    Request for surgical clearance:  1. What type of surgery is being performed? In office Prostate Biopsy  2. When is this surgery scheduled? 10/01/2018  3. What type of clearance is required (medical clearance vs. Pharmacy clearance to hold med vs. Both)? Pharmacy  4. Are there any medications that need to be held prior to surgery and how long?[place]l Plavix 5 days prior to procedure  5. Practice name and name of physician performing surgery? [facility], Dr. Louis Meckel  6. What is your office phone number [phone]   7.   What is your office fax number [phone]  8.   Anesthesia type (None, local, MAC, general) ? TBD   Stevon Gough 09/06/2018, 4:39 PM  _________________________________________________________________   (provider comments below)

## 2018-09-09 NOTE — Telephone Encounter (Signed)
   Primary Cardiologist: Dorris Carnes, MD  Chart reviewed as part of pre-operative protocol coverage. Given past medical history and time since last visit, based on ACC/AHA guidelines, [name] would be at acceptable risk for the planned procedure without further cardiovascular testing.   Pt may hold Plavix for 5 days prior to procedure as requested and resume after procedure as soon as safe.   I will route this recommendation to the requesting party via Epic fax function and remove from pre-op pool.  Please call with questions.  Daune Perch, NP 09/09/2018, 11:25 AM

## 2018-10-21 ENCOUNTER — Other Ambulatory Visit: Payer: Self-pay | Admitting: Urology

## 2018-10-21 ENCOUNTER — Telehealth: Payer: Self-pay | Admitting: Internal Medicine

## 2018-10-21 DIAGNOSIS — C61 Malignant neoplasm of prostate: Secondary | ICD-10-CM

## 2018-10-21 NOTE — Telephone Encounter (Signed)
   Primary Cardiologist: Dorris Carnes, MD  Chart reviewed as part of pre-operative protocol coverage. Patient was contacted 10/21/2018 in reference to pre-operative risk assessment for pending surgery as outlined below.  [name] was last seen on 04/15/18 by Dr. Harrington Challenger for CAD with hx of MI, and stents in 2007, HTN and HLD.  Since that day, [name] has done well from cardiac perspective able to meet 4 METS of activity without cardiac issue.   He may hold plavix for 5 days prior to procedure and resume when stable post op.   Therefore, based on ACC/AHA guidelines, the patient would be at acceptable risk for the planned procedure without further cardiovascular testing.   I will route this recommendation to the requesting party via Epic fax function and remove from pre-op pool.  Please call with questions.  Cecilie Kicks, NP 10/21/2018, 5:06 PM

## 2018-10-21 NOTE — Telephone Encounter (Signed)
New message      [facility] Pre-operative Risk Assessment    Request for surgical clearance:  1. What type of surgery is being performed? Robotic Prostatectomy   2. When is this surgery scheduled? 12/11/2018   3. What type of clearance is required (medical clearance vs. Pharmacy clearance to hold med vs. Both)? Both  4. Are there any medications that need to be held prior to surgery and how long?Plavix will leave up to provider to decide how long  5. Practice name and name of physician performing surgery?  [facility]  Dr. Lynnell Chad    6. What is your office phone [phone] ext [phone]   7.   What is your office fax number [phone]  8.   Anesthesia type (None, local, MAC, general) ? General    Maryjane Hurter 10/21/2018, 4:31 PM  _________________________________________________________________   (provider comments below)

## 2018-11-16 ENCOUNTER — Ambulatory Visit
Admission: RE | Admit: 2018-11-16 | Discharge: 2018-11-16 | Disposition: A | Discharge status: Home / Self Care | Payer: 59 | Source: Ambulatory Visit | Attending: Urology | Admitting: Urology

## 2018-11-16 ENCOUNTER — Other Ambulatory Visit: Payer: Self-pay

## 2018-11-16 DIAGNOSIS — C61 Malignant neoplasm of prostate: Secondary | ICD-10-CM

## 2018-11-16 MED ORDER — GADOBENATE DIMEGLUMINE 529 MG/ML IV SOLN
20.0000 mL | Freq: Once | INTRAVENOUS | Status: AC | PRN
  Administered 2018-11-16: 20 mL via INTRAVENOUS

## 2018-11-29 NOTE — Patient Instructions (Addendum)
DUE TO COVID-19 ONLY ONE VISITOR IS ALLOWED TO COME WITH YOU AND STAY IN THE WAITING ROOM ONLY DURING PRE OP AND PROCEDURE DAY OF SURGERY. THE 1 VISITOR MAY VISIT WITH YOU AFTER SURGERY IN YOUR PRIVATE ROOM DURING VISITING HOURS ONLY!  YOU NEED TO HAVE A COVID 19 TEST ON 12-07-2018 AT , THIS TEST MUST BE DONE BEFORE SURGERY, COME  [place], [place] , [address].  ([place]) ONCE YOUR COVID TEST IS COMPLETED, PLEASE BEGIN THE QUARANTINE INSTRUCTIONS AS OUTLINED IN YOUR HANDOUT.                [name]    Your procedure is scheduled on: 12-11-2018   Report to [facility] Main  Entrance   Report to  [place]  at  530 AM     Call this number if you have problems the morning of surgery [phone]    Remember: Do not eat food :After Midnight Monday NIGHT, CLEAR LIQUIDS ALL DAY Tuesday 12-10-2018. NO CLEAR LIQUIDS AFTER MIDNIGHT.  FOLLOW ALL BOWEL PREP INSTRUCTIONS FROM DR Louis Meckel.   BRUSH YOUR TEETH MORNING OF SURGERY AND RINSE YOUR MOUTH OUT, NO CHEWING GUM CANDY OR MINTS.      CLEAR LIQUID DIET   Foods Allowed                                                                     Foods Excluded  Coffee and tea, regular and decaf                             liquids that you cannot  Plain Jell-O any favor except red or purple                                           see through such as: Fruit ices (not with fruit pulp)                                     milk, soups, orange juice  Iced Popsicles                                    All solid food Carbonated beverages, regular and diet                                    Cranberry, grape and apple juices Sports drinks like Gatorade Lightly seasoned clear broth or consume(fat free) Sugar, honey syrup  Sample Menu Breakfast                                Lunch  Supper Cranberry juice                    Beef broth                            Chicken  broth Jell-O                                     Grape juice                           Apple juice Coffee or tea                        Jell-O                                      Popsicle                                                Coffee or tea                        Coffee or tea  _____________________________________________________________________    Take these medicines the morning of surgery with A SIP OF WATER: levothyroxine (synthroid), keppra, metoprolol succinate   TAKE METFORMIN AS USUAL DAY BEFORE SURGERY DO NOT TAKE  SOLIQUA  DAY BEFORE SURGERY    DO NOT TAKE METFORMIN OR SOLIQUA DAY OF SURGERY.  How to Manage Your Diabetes Before and After Surgery  Why is it important to control my blood sugar before and after surgery? . Improving blood sugar levels before and after surgery helps healing and can limit problems. . A way of improving blood sugar control is eating a healthy diet by: o  Eating less sugar and carbohydrates o  Increasing activity/exercise o  Talking with your doctor about reaching your blood sugar goals . High blood sugars (greater than 180 mg/dL) can raise your risk of infections and slow your recovery, so you will need to focus on controlling your diabetes during the weeks before surgery. . Make sure that the doctor who takes care of your diabetes knows about your planned surgery including the date and location.  How do I manage my blood sugar before surgery? . Check your blood sugar at least 4 times a day, starting 2 days before surgery, to make sure that the level is not too high or low. o Check your blood sugar the morning of your surgery when you wake up and every 2 hours until you get to the Short Stay unit. . If your blood sugar is less than 70 mg/dL, you will need to treat for low blood sugar: o Do not take insulin. o Treat a low blood sugar (less than 70 mg/dL) with  cup of clear juice (cranberry or apple), 4 glucose tablets, OR glucose  gel. o Recheck blood sugar in 15 minutes after treatment (to make sure it is greater than 70 mg/dL). If your blood sugar is not greater than 70 mg/dL on recheck, call [phone] for further instructions. . Report your blood sugar to  the short stay nurse when you get to Short Stay.  . If you are admitted to the hospital after surgery: o Your blood sugar will be checked by the staff and you will probably be given insulin after surgery (instead of oral diabetes medicines) to make sure you have good blood sugar levels. o The goal for blood sugar control after surgery is 80-180 mg/dL.    DO NOT TAKE ANY DIABETIC MEDICATIONS DAY OF YOUR SURGERY                               You may not have any metal on your body including hair pins and              piercings  Do not wear jewelry, make-up, lotions, powders or perfumes, deodorant                      Men may shave face and neck.   Do not bring valuables to the hospital. [employer].  Contacts, dentures or bridgework may not be worn into surgery.  Leave suitcase in the car. After surgery it may be brought to your room.      _____________________________________________________________________             [facility] - Preparing for Surgery Before surgery, you can play an important role.  Because skin is not sterile, your skin needs to be as free of germs as possible.  You can reduce the number of germs on your skin by washing with CHG (chlorahexidine gluconate) soap before surgery.  CHG is an antiseptic cleaner which kills germs and bonds with the skin to continue killing germs even after washing. Please DO NOT use if you have an allergy to CHG or antibacterial soaps.  If your skin becomes reddened/irritated stop using the CHG and inform your nurse when you arrive at Short Stay. Do not shave (including legs and underarms) for at least 48 hours prior to the first CHG shower.  You may shave your  face/neck. Please follow these instructions carefully:  1.  Shower with CHG Soap the night before surgery and the  morning of Surgery.  2.  If you choose to wash your hair, wash your hair first as usual with your  normal  shampoo.  3.  After you shampoo, rinse your hair and body thoroughly to remove the  shampoo.                           4.  Use CHG as you would any other liquid soap.  You can apply chg directly  to the skin and wash                       Gently with a scrungie or clean washcloth.  5.  Apply the CHG Soap to your body ONLY FROM THE NECK DOWN.   Do not use on face/ open                           Wound or open sores. Avoid contact with eyes, ears mouth and genitals (private parts).  Wash face,  Genitals (private parts) with your normal soap.             6.  Wash thoroughly, paying special attention to the area where your surgery  will be performed.  7.  Thoroughly rinse your body with warm water from the neck down.  8.  DO NOT shower/wash with your normal soap after using and rinsing off  the CHG Soap.                9.  Pat yourself dry with a clean towel.            10.  Wear clean pajamas.            11.  Place clean sheets on your bed the night of your first shower and do not  sleep with pets. Day of Surgery : Do not apply any lotions/deodorants the morning of surgery.  Please wear clean clothes to the hospital/surgery center.  FAILURE TO FOLLOW THESE INSTRUCTIONS MAY RESULT IN THE CANCELLATION OF YOUR SURGERY PATIENT SIGNATURE_________________________________  NURSE SIGNATURE__________________________________  ________________________________________________________________________   Adam Phenix  An incentive spirometer is a tool that can help keep your lungs clear and active. This tool measures how well you are filling your lungs with each breath. Taking long deep breaths may help reverse or decrease the chance of developing breathing  (pulmonary) problems (especially infection) following:  A long period of time when you are unable to move or be active. BEFORE THE PROCEDURE   If the spirometer includes an indicator to show your best effort, your nurse or respiratory therapist will set it to a desired goal.  If possible, sit up straight or lean slightly forward. Try not to slouch.  Hold the incentive spirometer in an upright position. INSTRUCTIONS FOR USE  1. Sit on the edge of your bed if possible, or sit up as far as you can in bed or on a chair. 2. Hold the incentive spirometer in an upright position. 3. Breathe out normally. 4. Place the mouthpiece in your mouth and seal your lips tightly around it. 5. Breathe in slowly and as deeply as possible, raising the piston or the ball toward the top of the column. 6. Hold your breath for 3-5 seconds or for as long as possible. Allow the piston or ball to fall to the bottom of the column. 7. Remove the mouthpiece from your mouth and breathe out normally. 8. Rest for a few seconds and repeat Steps 1 through 7 at least 10 times every 1-2 hours when you are awake. Take your time and take a few normal breaths between deep breaths. 9. The spirometer may include an indicator to show your best effort. Use the indicator as a goal to work toward during each repetition. 10. After each set of 10 deep breaths, practice coughing to be sure your lungs are clear. If you have an incision (the cut made at the time of surgery), support your incision when coughing by placing a pillow or rolled up towels firmly against it. Once you are able to get out of bed, walk around indoors and cough well. You may stop using the incentive spirometer when instructed by your caregiver.  RISKS AND COMPLICATIONS  Take your time so you do not get dizzy or light-headed.  If you are in pain, you may need to take or ask for pain medication before doing incentive spirometry. It is harder to take a deep breath if you  are having  pain. AFTER USE  Rest and breathe slowly and easily.  It can be helpful to keep track of a log of your progress. Your caregiver can provide you with a simple table to help with this. If you are using the spirometer at home, follow these instructions: [place] IF:   You are having difficultly using the spirometer.  You have trouble using the spirometer as often as instructed.  Your pain medication is not giving enough relief while using the spirometer.  You develop fever of 100.5 F (38.1 C) or higher. SEEK IMMEDIATE MEDICAL CARE IF:   You cough up bloody sputum that had not been present before.  You develop fever of 102 F (38.9 C) or greater.  You develop worsening pain at or near the incision site. MAKE SURE YOU:   Understand these instructions.  Will watch your condition.  Will get help right away if you are not doing well or get worse. Document Released: 07/17/2006 Document Revised: 05/29/2011 Document Reviewed: 09/17/2006 ExitCare Patient Information 2014 [employer], [place].   ________________________________________________________________________  WHAT IS A BLOOD TRANSFUSION? Blood Transfusion Information  A transfusion is the replacement of blood or some of its parts. Blood is made up of multiple cells which provide different functions.  Red blood cells carry oxygen and are used for blood loss replacement.  White blood cells fight against infection.  Platelets control bleeding.  Plasma helps clot blood.  Other blood products are available for specialized needs, such as hemophilia or other clotting disorders. BEFORE THE TRANSFUSION  Who gives blood for transfusions?   Healthy volunteers who are fully evaluated to make sure their blood is safe. This is blood bank blood. Transfusion therapy is the safest it has ever been in the practice of medicine. Before blood is taken from a donor, a complete history is taken to make sure that person has  no history of diseases nor engages in risky social behavior (examples are intravenous drug use or sexual activity with multiple partners). The donor's travel history is screened to minimize risk of transmitting infections, such as malaria. The donated blood is tested for signs of infectious diseases, such as HIV and hepatitis. The blood is then tested to be sure it is compatible with you in order to minimize the chance of a transfusion reaction. If you or a relative donates blood, this is often done in anticipation of surgery and is not appropriate for emergency situations. It takes many days to process the donated blood. RISKS AND COMPLICATIONS Although transfusion therapy is very safe and saves many lives, the main dangers of transfusion include:   Getting an infectious disease.  Developing a transfusion reaction. This is an allergic reaction to something in the blood you were given. Every precaution is taken to prevent this. The decision to have a blood transfusion has been considered carefully by your caregiver before blood is given. Blood is not given unless the benefits outweigh the risks. AFTER THE TRANSFUSION  Right after receiving a blood transfusion, you will usually feel much better and more energetic. This is especially true if your red blood cells have gotten low (anemic). The transfusion raises the level of the red blood cells which carry oxygen, and this usually causes an energy increase.  The nurse administering the transfusion will monitor you carefully for complications. HOME CARE INSTRUCTIONS  No special instructions are needed after a transfusion. You may find your energy is better. Speak with your caregiver about any limitations on activity for underlying diseases  you may have. SEEK MEDICAL CARE IF:   Your condition is not improving after your transfusion.  You develop redness or irritation at the intravenous (IV) site. SEEK IMMEDIATE MEDICAL CARE IF:  Any of the following  symptoms occur over the next 12 hours:  Shaking chills.  You have a temperature by mouth above 102 F (38.9 C), not controlled by medicine.  Chest, back, or muscle pain.  People around you feel you are not acting correctly or are confused.  Shortness of breath or difficulty breathing.  Dizziness and fainting.  You get a rash or develop hives.  You have a decrease in urine output.  Your urine turns a dark color or changes to pink, red, or brown. Any of the following symptoms occur over the next 10 days:  You have a temperature by mouth above 102 F (38.9 C), not controlled by medicine.  Shortness of breath.  Weakness after normal activity.  The white part of the eye turns yellow (jaundice).  You have a decrease in the amount of urine or are urinating less often.  Your urine turns a dark color or changes to pink, red, or brown. Document Released: 03/03/2000 Document Revised: 05/29/2011 Document Reviewed: 10/21/2007 [facility] Patient Information 2014 [place], [place].  _______________________________________________________________________

## 2018-12-04 ENCOUNTER — Other Ambulatory Visit: Payer: Self-pay

## 2018-12-04 ENCOUNTER — Encounter (HOSPITAL_COMMUNITY)
Admission: RE | Admit: 2018-12-04 | Discharge: 2018-12-04 | Disposition: A | Discharge status: Home / Self Care | Payer: 59 | Source: Ambulatory Visit | Attending: Urology | Admitting: Urology

## 2018-12-04 ENCOUNTER — Encounter (HOSPITAL_COMMUNITY): Payer: Self-pay

## 2018-12-04 DIAGNOSIS — Z01812 Encounter for preprocedural laboratory examination: Secondary | ICD-10-CM | POA: Insufficient documentation

## 2018-12-04 DIAGNOSIS — C61 Malignant neoplasm of prostate: Secondary | ICD-10-CM | POA: Insufficient documentation

## 2018-12-04 HISTORY — DX: Malignant (primary) neoplasm, unspecified: C80.1

## 2018-12-04 HISTORY — DX: Unspecified convulsions: R56.9

## 2018-12-04 HISTORY — DX: Personal history of urinary calculi: Z87.442

## 2018-12-04 LAB — ABO/RH: ABO/RH(D): A POS

## 2018-12-04 LAB — CBC
HCT: 43.5 % (ref 39.0–52.0)
Hemoglobin: 14.6 g/dL (ref 13.0–17.0)
MCH: 30.1 pg (ref 26.0–34.0)
MCHC: 33.6 g/dL (ref 30.0–36.0)
MCV: 89.7 fL (ref 80.0–100.0)
Platelets: 159 10*3/uL (ref 150–400)
RBC: 4.85 MIL/uL (ref 4.22–5.81)
RDW: 12 % (ref 11.5–15.5)
WBC: 8.5 10*3/uL (ref 4.0–10.5)
nRBC: 0 % (ref 0.0–0.2)

## 2018-12-04 LAB — COMPREHENSIVE METABOLIC PANEL
ALT: 40 U/L (ref 0–44)
AST: 31 U/L (ref 15–41)
Albumin: 3.8 g/dL (ref 3.5–5.0)
Alkaline Phosphatase: 76 U/L (ref 38–126)
Anion gap: 9 (ref 5–15)
BUN: 22 mg/dL (ref 8–23)
CO2: 23 mmol/L (ref 22–32)
Calcium: 9.4 mg/dL (ref 8.9–10.3)
Chloride: 104 mmol/L (ref 98–111)
Creatinine, Ser: 1.22 mg/dL (ref 0.61–1.24)
GFR calc Af Amer: >60 mL/min (ref >60)
GFR calc non Af Amer: >60 mL/min (ref >60)
Glucose, Bld: 235 mg/dL — ABNORMAL HIGH (ref 70–99)
Potassium: 5 mmol/L (ref 3.5–5.1)
Sodium: 136 mmol/L (ref 135–145)
Total Bilirubin: 0.6 mg/dL (ref 0.3–1.2)
Total Protein: 7.5 g/dL (ref 6.5–8.1)

## 2018-12-04 LAB — GLUCOSE, CAPILLARY: Glucose-Capillary: 240 mg/dL — ABNORMAL HIGH (ref 70–99)

## 2018-12-04 NOTE — Progress Notes (Signed)
PCP - dr Burnard Bunting Cardiologist -  Dr Dorris Carnes, cardiac clearance 10-21-18 epic  Chest x-ray -chest ct 04-20-18 epic  EKG - 04-15-18 epic Stress Test - none ECHO - none Cardiac Cath - none  Sleep Study - none CPAP - none  Fasting Blood Sugar - 140"s Checks Blood Sugar q am  Blood Thinner Instructions:plavix, stop x 5 days per dr Harrington Challenger Aspirin Instructions:none Last Dose:  Anesthesia review: chart to jessica zanetto pa for review  Patient denies shortness of breath, fever, cough and chest pain at PAT appointment   Patient verbalized understanding of instructions that were given to them at the PAT appointment. Patient was also instructed that they will need to review over the PAT instructions again at home before surgery.

## 2018-12-05 LAB — URINE CULTURE: Culture: NO GROWTH

## 2018-12-05 LAB — HEMOGLOBIN A1C
Hgb A1c MFr Bld: 9.7 % — ABNORMAL HIGH (ref 4.8–5.6)
Mean Plasma Glucose: 232 mg/dL

## 2018-12-05 NOTE — Anesthesia Preprocedure Evaluation (Addendum)
Anesthesia Evaluation  Patient identified by MRN, date of birth, ID band Patient awake    Reviewed: Allergy & Precautions, NPO status , Patient's Chart, lab work & pertinent test results, reviewed documented beta blocker date and time   History of Anesthesia Complications Negative for: history of anesthetic complications  Airway Mallampati: III  TM Distance: >3 FB Neck ROM: Full    Dental  (+) Dental Advisory Given, Teeth Intact   Pulmonary neg pulmonary ROS,    Pulmonary exam normal        Cardiovascular (-) angina+ CAD and + Cardiac Stents  Normal cardiovascular exam   Per Cecilie Kicks, NP, "Kerman Passey last seen on 1/27/20by Dr. Harrington Challenger for CAD with hx of MI, and stents in 2007, HTN and HLD. Since that day, MOHD BOLSINGER done well from cardiac perspective able to meet 4 METS of activity without cardiac issue. He may hold plavix for 5 days prior to procedure and resume when stable post op.Therefore, based on ACC/AHA guidelines, the patient would be at acceptable risk for the planned procedure without further cardiovascular testing."    Neuro/Psych Seizures -, Well Controlled,   Neuromuscular disease (diabetic neuropathy) negative psych ROS   GI/Hepatic negative GI ROS, Neg liver ROS,   Endo/Other  diabetes, Type 2, Insulin Dependent, Oral Hypoglycemic AgentsHypothyroidism  Obesity   Renal/GU negative Renal ROS    Prostate cancer     Musculoskeletal negative musculoskeletal ROS (+)   Abdominal   Peds  Hematology negative hematology ROS (+)   Anesthesia Other Findings   Reproductive/Obstetrics                           Anesthesia Physical Anesthesia Plan  ASA: III  Anesthesia Plan: General   Post-op Pain Management:    Induction: Intravenous  PONV Risk Score and Plan: 4 or greater and Treatment may vary due to age or medical condition, Ondansetron, Scopolamine patch -  Pre-op, Midazolam and Dexamethasone  Airway Management Planned: Oral ETT  Additional Equipment: None  Intra-op Plan:   Post-operative Plan: Extubation in OR  Informed Consent: I have reviewed the patients History and Physical, chart, labs and discussed the procedure including the risks, benefits and alternatives for the proposed anesthesia with the patient or authorized representative who has indicated his/her understanding and acceptance.     Dental advisory given  Plan Discussed with: CRNA and Anesthesiologist  Anesthesia Plan Comments: (BP cuff on each arm, alternating during procedure. 2 peripheral IVs)      Anesthesia Quick Evaluation

## 2018-12-05 NOTE — Progress Notes (Signed)
Anesthesia Chart Review   Case: [id number] Date/Time: 12/11/18 0700   Procedures:      XI ROBOTIC ASSISTED LAPAROSCOPIC RADICAL PROSTATECTOMY (N/A )     PELVIC LYMPH NODE DISSECTION (Bilateral )   Anesthesia type: General   Pre-op diagnosis: PROSTATE CANCER   Location: [facility] ROOM 03 / [facility]   Surgeon: Ardis Hughs, MD      DISCUSSION:63 y.o. never smoker with h/o HLD, CAD w/hx MI + stents 2007, DM II, prostate cancer scheduled for above procedure 12/04/2018 with Dr. Louis Meckel.   Cleared by cardiology 10/21/2018.  Per Cecilie Kicks, NP, "[name] was last seen on 04/15/18 by Dr. Harrington Challenger for CAD with hx of MI, and stents in 2007, HTN and HLD.  Since that day, [name] has done well from cardiac perspective able to meet 4 METS of activity without cardiac issue.  He may hold plavix for 5 days prior to procedure and resume when stable post op.  Therefore, based on ACC/AHA guidelines, the patient would be at acceptable risk for the planned procedure without further cardiovascular testing."  Anticipate pt can proceed with planned procedure barring acute status change.   VS: BP (!) 151/73   Pulse 85   Temp 36.8 C (Oral)   Resp 18   Ht 5' 11.5" (1.816 m)   Wt 103.6 kg   SpO2 100%   BMI 31.40 kg/m   PROVIDERS: Burnard Bunting, MD is PCP   Dorris Carnes, MD is Cardiologist  LABS: Labs reviewed: Acceptable for surgery. and forwarded to surgeon (all labs ordered are listed, but only abnormal results are displayed)  Labs Reviewed  COMPREHENSIVE METABOLIC PANEL - Abnormal; Notable for the following components:      Result Value   Glucose, Bld 235 (*)    All other components within normal limits  HEMOGLOBIN A1C - Abnormal; Notable for the following components:   Hgb A1c MFr Bld 9.7 (*)    All other components within normal limits  GLUCOSE, CAPILLARY - Abnormal; Notable for the following components:   Glucose-Capillary 240 (*)    All other components within normal limits   URINE CULTURE  CBC  TYPE AND SCREEN  ABO/RH     IMAGES:   EKG: 04/15/2018 Rate 80 bpm Normal sinus rhythm  Normal ECG   CV: Echo 07/09/2018 SUMMARY  - Overall left ventricular systolic function was normal. Left     ventricular ejection fraction was estimated to be 55 %. Left     ventricular wall thickness was mildly increased. Doppler     parameters were consistent with abnormal left ventricular     relaxation.  - The left atrium was mildly dilated.  - The pulmonary veins were grossly normal.  - The right ventricle was mildly dilated. Right ventricular     systolic function was mildly reduced.  - Suggest cardiac MRI or CT scan to assess para-cardiac mass   IMPRESSIONS  - Suggest cardiac MRI or CT scan to assess para-cardiac mass  Past Medical History:  Diagnosis Date  . Cancer [facility])    prostate  . Coronary artery disease   . Diabetes mellitus    type 2  . 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 Diabetic neuropathy ([place]) 01/08/2018   feet  . History of kidney stones   . Hyperlipidemia   . Other iatrogenic hypothyroidism   . Seizure [facility])    last seizure 2000 due to vasculitis    Past Surgical  History:  Procedure Laterality Date  . BRAIN SURGERY  brain biopsy   2000 to figure out cause of headaches dx with vasculitis  . PROSTATE BIOPSY  11/1998  . rotater cuff surgery Left 01/17/2017  . STENT TO HEART     X 1    MEDICATIONS: . levETIRAcetam (KEPPRA) 750 MG tablet  . acetaminophen (TYLENOL) 500 MG tablet  . clopidogrel (PLAVIX) 75 MG tablet  . CRESTOR 10 MG tablet  . gabapentin (NEURONTIN) 300 MG capsule  . Insulin Glargine-Lixisenatide (SOLIQUA [place])  . levothyroxine (SYNTHROID, LEVOTHROID) 137 MCG tablet  . metFORMIN (GLUCOPHAGE) 500 MG tablet  . metoprolol succinate (TOPROL-XL) 25 MG 24 hr tablet   No current facility-administered medications for this encounter.     Maia Plan [facility]  Pre-Surgical Testing [phone] 12/05/18  3:49 PM

## 2018-12-07 ENCOUNTER — Other Ambulatory Visit (HOSPITAL_COMMUNITY)
Admission: RE | Admit: 2018-12-07 | Discharge: 2018-12-07 | Disposition: A | Discharge status: Home / Self Care | Payer: 59 | Source: Ambulatory Visit | Attending: Urology | Admitting: Urology

## 2018-12-07 DIAGNOSIS — Z01812 Encounter for preprocedural laboratory examination: Secondary | ICD-10-CM | POA: Insufficient documentation

## 2018-12-07 DIAGNOSIS — Z20828 Contact with and (suspected) exposure to other viral communicable diseases: Secondary | ICD-10-CM | POA: Diagnosis not present

## 2018-12-08 LAB — NOVEL CORONAVIRUS, NAA (HOSP ORDER, SEND-OUT TO REF LAB; TAT 18-24 HRS): SARS-CoV-2, NAA: NOT DETECTED

## 2018-12-11 ENCOUNTER — Other Ambulatory Visit: Payer: Self-pay

## 2018-12-11 ENCOUNTER — Ambulatory Visit (HOSPITAL_COMMUNITY): Payer: 59 | Admitting: Physician Assistant

## 2018-12-11 ENCOUNTER — Encounter (HOSPITAL_COMMUNITY)
Admission: AD | Disposition: A | Discharge status: Home / Self Care | Payer: Self-pay | Source: Ambulatory Visit | Attending: Urology

## 2018-12-11 ENCOUNTER — Ambulatory Visit (HOSPITAL_COMMUNITY): Payer: 59 | Admitting: Anesthesiology

## 2018-12-11 ENCOUNTER — Observation Stay (HOSPITAL_COMMUNITY)
Admission: AD | Admit: 2018-12-11 | Discharge: 2018-12-12 | Disposition: A | Discharge status: Home / Self Care | Payer: 59 | Source: Ambulatory Visit | Attending: Urology | Admitting: Urology

## 2018-12-11 ENCOUNTER — Encounter (HOSPITAL_COMMUNITY): Payer: Self-pay | Admitting: Emergency Medicine

## 2018-12-11 DIAGNOSIS — C61 Malignant neoplasm of prostate: Secondary | ICD-10-CM | POA: Diagnosis not present

## 2018-12-11 DIAGNOSIS — E669 Obesity, unspecified: Secondary | ICD-10-CM | POA: Insufficient documentation

## 2018-12-11 DIAGNOSIS — E1144 Type 2 diabetes mellitus with diabetic amyotrophy: Secondary | ICD-10-CM | POA: Insufficient documentation

## 2018-12-11 DIAGNOSIS — Z794 Long term (current) use of insulin: Secondary | ICD-10-CM | POA: Insufficient documentation

## 2018-12-11 DIAGNOSIS — I10 Essential (primary) hypertension: Secondary | ICD-10-CM | POA: Diagnosis not present

## 2018-12-11 DIAGNOSIS — E039 Hypothyroidism, unspecified: Secondary | ICD-10-CM | POA: Diagnosis not present

## 2018-12-11 DIAGNOSIS — Z79890 Hormone replacement therapy (postmenopausal): Secondary | ICD-10-CM | POA: Insufficient documentation

## 2018-12-11 DIAGNOSIS — Z7982 Long term (current) use of aspirin: Secondary | ICD-10-CM | POA: Diagnosis not present

## 2018-12-11 DIAGNOSIS — R569 Unspecified convulsions: Secondary | ICD-10-CM | POA: Diagnosis not present

## 2018-12-11 DIAGNOSIS — Z6831 Body mass index (BMI) 31.0-31.9, adult: Secondary | ICD-10-CM | POA: Insufficient documentation

## 2018-12-11 DIAGNOSIS — I2510 Atherosclerotic heart disease of native coronary artery without angina pectoris: Secondary | ICD-10-CM | POA: Diagnosis not present

## 2018-12-11 DIAGNOSIS — Z955 Presence of coronary angioplasty implant and graft: Secondary | ICD-10-CM | POA: Diagnosis not present

## 2018-12-11 DIAGNOSIS — E782 Mixed hyperlipidemia: Secondary | ICD-10-CM | POA: Diagnosis not present

## 2018-12-11 DIAGNOSIS — I252 Old myocardial infarction: Secondary | ICD-10-CM | POA: Insufficient documentation

## 2018-12-11 DIAGNOSIS — Z79899 Other long term (current) drug therapy: Secondary | ICD-10-CM | POA: Diagnosis not present

## 2018-12-11 DIAGNOSIS — E1140 Type 2 diabetes mellitus with diabetic neuropathy, unspecified: Secondary | ICD-10-CM | POA: Insufficient documentation

## 2018-12-11 HISTORY — PX: ROBOT ASSISTED LAPAROSCOPIC RADICAL PROSTATECTOMY: SHX5141

## 2018-12-11 HISTORY — PX: PELVIC LYMPH NODE DISSECTION: SHX6543

## 2018-12-11 LAB — GLUCOSE, CAPILLARY
Glucose-Capillary: 185 mg/dL — ABNORMAL HIGH (ref 70–99)
Glucose-Capillary: 299 mg/dL — ABNORMAL HIGH (ref 70–99)
Glucose-Capillary: 264 mg/dL — ABNORMAL HIGH (ref 70–99)
Glucose-Capillary: 253 mg/dL — ABNORMAL HIGH (ref 70–99)
Glucose-Capillary: 213 mg/dL — ABNORMAL HIGH (ref 70–99)

## 2018-12-11 LAB — TYPE AND SCREEN
ABO/RH(D): A POS
Antibody Screen: NEGATIVE

## 2018-12-11 LAB — HEMOGLOBIN AND HEMATOCRIT, BLOOD
HCT: 38.2 % — ABNORMAL LOW (ref 39.0–52.0)
Hemoglobin: 13 g/dL (ref 13.0–17.0)

## 2018-12-11 SURGERY — PROSTATECTOMY, RADICAL, ROBOT-ASSISTED, LAPAROSCOPIC
Anesthesia: General

## 2018-12-11 MED ORDER — PHENYLEPHRINE 40 MCG/ML (10ML) SYRINGE FOR IV PUSH (FOR BLOOD PRESSURE SUPPORT)
PREFILLED_SYRINGE | INTRAVENOUS | Status: AC
  Filled 2018-12-11: qty 10

## 2018-12-11 MED ORDER — INSULIN ASPART 100 UNIT/ML ~~LOC~~ SOLN
SUBCUTANEOUS | Status: AC
  Administered 2018-12-11: 5 [IU] via SUBCUTANEOUS
  Filled 2018-12-11: qty 1

## 2018-12-11 MED ORDER — KETOROLAC TROMETHAMINE 15 MG/ML IJ SOLN
15.0000 mg | Freq: Four times a day (QID) | INTRAMUSCULAR | Status: DC
  Administered 2018-12-11 – 2018-12-12 (×4): 15 mg via INTRAVENOUS
  Filled 2018-12-11 (×3): qty 1

## 2018-12-11 MED ORDER — BUPIVACAINE-EPINEPHRINE 0.5% -1:200000 IJ SOLN
INTRAMUSCULAR | Status: AC
  Filled 2018-12-11: qty 1

## 2018-12-11 MED ORDER — ACETAMINOPHEN 10 MG/ML IV SOLN
INTRAVENOUS | Status: AC
  Administered 2018-12-11: 1000 mg via INTRAVENOUS
  Filled 2018-12-11: qty 100

## 2018-12-11 MED ORDER — ROCURONIUM BROMIDE 100 MG/10ML IV SOLN
INTRAVENOUS | Status: DC | PRN
  Administered 2018-12-11: 20 mg via INTRAVENOUS
  Administered 2018-12-11: 70 mg via INTRAVENOUS
  Administered 2018-12-11: 30 mg via INTRAVENOUS
  Administered 2018-12-11: 20 mg via INTRAVENOUS

## 2018-12-11 MED ORDER — SULFAMETHOXAZOLE-TRIMETHOPRIM 800-160 MG PO TABS: 1.0000 | ORAL_TABLET | Freq: Two times a day (BID) | ORAL | 0 refills | Status: DC

## 2018-12-11 MED ORDER — ACETAMINOPHEN 10 MG/ML IV SOLN
1000.0000 mg | Freq: Four times a day (QID) | INTRAVENOUS | Status: AC
  Administered 2018-12-11 – 2018-12-12 (×4): 1000 mg via INTRAVENOUS
  Filled 2018-12-11 (×3): qty 100

## 2018-12-11 MED ORDER — DIPHENHYDRAMINE HCL 12.5 MG/5ML PO ELIX: 12.5000 mg | ORAL_SOLUTION | Freq: Four times a day (QID) | ORAL | Status: DC | PRN

## 2018-12-11 MED ORDER — HYDROMORPHONE HCL 1 MG/ML IJ SOLN
0.5000 mg | INTRAMUSCULAR | Status: DC | PRN
  Administered 2018-12-11: 1 mg via INTRAVENOUS
  Filled 2018-12-11: qty 1

## 2018-12-11 MED ORDER — SODIUM CHLORIDE 0.45 % IV SOLN
INTRAVENOUS | Status: DC
  Administered 2018-12-11 – 2018-12-12 (×2): via INTRAVENOUS

## 2018-12-11 MED ORDER — LIDOCAINE 2% (20 MG/ML) 5 ML SYRINGE
INTRAMUSCULAR | Status: AC
  Filled 2018-12-11: qty 5

## 2018-12-11 MED ORDER — INSULIN ASPART 100 UNIT/ML ~~LOC~~ SOLN
5.0000 [IU] | Freq: Once | SUBCUTANEOUS | Status: AC
  Administered 2018-12-11: 12:00:00 5 [IU] via SUBCUTANEOUS

## 2018-12-11 MED ORDER — DOCUSATE SODIUM 100 MG PO CAPS
100.0000 mg | ORAL_CAPSULE | Freq: Two times a day (BID) | ORAL | Status: DC
  Administered 2018-12-11 – 2018-12-12 (×2): 100 mg via ORAL
  Filled 2018-12-11 (×2): qty 1

## 2018-12-11 MED ORDER — LABETALOL HCL 5 MG/ML IV SOLN
INTRAVENOUS | Status: DC | PRN
  Administered 2018-12-11: 2.5 mg via INTRAVENOUS

## 2018-12-11 MED ORDER — INSULIN ASPART 100 UNIT/ML ~~LOC~~ SOLN
0.0000 [IU] | Freq: Three times a day (TID) | SUBCUTANEOUS | Status: DC
  Administered 2018-12-11: 8 [IU] via SUBCUTANEOUS
  Administered 2018-12-12: 09:00:00 3 [IU] via SUBCUTANEOUS

## 2018-12-11 MED ORDER — FENTANYL CITRATE (PF) 100 MCG/2ML IJ SOLN
25.0000 ug | INTRAMUSCULAR | Status: DC | PRN
  Administered 2018-12-11 (×3): 50 ug via INTRAVENOUS

## 2018-12-11 MED ORDER — LACTATED RINGERS IV SOLN
INTRAVENOUS | Status: DC
  Administered 2018-12-11 (×2): via INTRAVENOUS

## 2018-12-11 MED ORDER — DIPHENHYDRAMINE HCL 50 MG/ML IJ SOLN: 12.5000 mg | Freq: Four times a day (QID) | INTRAMUSCULAR | Status: DC | PRN

## 2018-12-11 MED ORDER — KETOROLAC TROMETHAMINE 15 MG/ML IJ SOLN
INTRAMUSCULAR | Status: AC
  Filled 2018-12-11: qty 1

## 2018-12-11 MED ORDER — SODIUM CHLORIDE 0.9 % IV BOLUS
1000.0000 mL | Freq: Once | INTRAVENOUS | Status: AC
  Administered 2018-12-11: 1000 mL via INTRAVENOUS

## 2018-12-11 MED ORDER — ONDANSETRON HCL 4 MG/2ML IJ SOLN: 4.0000 mg | INTRAMUSCULAR | Status: DC | PRN

## 2018-12-11 MED ORDER — HYDRALAZINE HCL 20 MG/ML IJ SOLN: 5.0000 mg | INTRAMUSCULAR | Status: DC | PRN

## 2018-12-11 MED ORDER — MIDAZOLAM HCL 2 MG/2ML IJ SOLN
INTRAMUSCULAR | Status: AC
  Filled 2018-12-11: qty 2

## 2018-12-11 MED ORDER — ROCURONIUM BROMIDE 10 MG/ML (PF) SYRINGE
PREFILLED_SYRINGE | INTRAVENOUS | Status: AC
Start: 2018-12-11 — End: ?
  Filled 2018-12-11: qty 10

## 2018-12-11 MED ORDER — MIDAZOLAM HCL 5 MG/5ML IJ SOLN
INTRAMUSCULAR | Status: DC | PRN
  Administered 2018-12-11: 2 mg via INTRAVENOUS

## 2018-12-11 MED ORDER — ONDANSETRON HCL 4 MG/2ML IJ SOLN
INTRAMUSCULAR | Status: DC | PRN
  Administered 2018-12-11: 4 mg via INTRAVENOUS

## 2018-12-11 MED ORDER — BELLADONNA ALKALOIDS-OPIUM 16.2-60 MG RE SUPP
1.0000 | Freq: Four times a day (QID) | RECTAL | Status: DC | PRN
  Administered 2018-12-11: 14:00:00 1 via RECTAL
  Filled 2018-12-11: qty 1

## 2018-12-11 MED ORDER — PHENYLEPHRINE 40 MCG/ML (10ML) SYRINGE FOR IV PUSH (FOR BLOOD PRESSURE SUPPORT)
PREFILLED_SYRINGE | INTRAVENOUS | Status: DC | PRN
  Administered 2018-12-11: 120 ug via INTRAVENOUS
  Administered 2018-12-11: 40 ug via INTRAVENOUS

## 2018-12-11 MED ORDER — PROMETHAZINE HCL 25 MG/ML IJ SOLN: 6.2500 mg | INTRAMUSCULAR | Status: DC | PRN

## 2018-12-11 MED ORDER — LEVETIRACETAM 500 MG PO TABS
750.0000 mg | ORAL_TABLET | Freq: Two times a day (BID) | ORAL | Status: DC
  Administered 2018-12-11 – 2018-12-12 (×2): 750 mg via ORAL
  Filled 2018-12-11 (×2): qty 1

## 2018-12-11 MED ORDER — FLEET ENEMA 7-19 GM/118ML RE ENEM: 1.0000 | ENEMA | Freq: Once | RECTAL | Status: DC

## 2018-12-11 MED ORDER — OXYCODONE HCL 5 MG/5ML PO SOLN: 5.0000 mg | Freq: Once | ORAL | Status: DC | PRN

## 2018-12-11 MED ORDER — LABETALOL HCL 5 MG/ML IV SOLN
INTRAVENOUS | Status: AC
  Filled 2018-12-11: qty 4

## 2018-12-11 MED ORDER — SUGAMMADEX SODIUM 200 MG/2ML IV SOLN
INTRAVENOUS | Status: DC | PRN
  Administered 2018-12-11: 200 mg via INTRAVENOUS

## 2018-12-11 MED ORDER — PROPOFOL 10 MG/ML IV BOLUS
INTRAVENOUS | Status: DC | PRN
  Administered 2018-12-11: 180 mg via INTRAVENOUS

## 2018-12-11 MED ORDER — LEVOTHYROXINE SODIUM 25 MCG PO TABS
137.0000 ug | ORAL_TABLET | Freq: Every day | ORAL | Status: DC
  Administered 2018-12-12: 06:00:00 137 ug via ORAL
  Filled 2018-12-11: qty 1

## 2018-12-11 MED ORDER — BACITRACIN-NEOMYCIN-POLYMYXIN 400-5-5000 EX OINT: 1.0000 "application " | TOPICAL_OINTMENT | Freq: Three times a day (TID) | CUTANEOUS | Status: DC | PRN

## 2018-12-11 MED ORDER — ONDANSETRON HCL 4 MG/2ML IJ SOLN
INTRAMUSCULAR | Status: AC
  Filled 2018-12-11: qty 2

## 2018-12-11 MED ORDER — METOPROLOL SUCCINATE ER 25 MG PO TB24
25.0000 mg | ORAL_TABLET | Freq: Every day | ORAL | Status: DC
  Administered 2018-12-12: 25 mg via ORAL
  Filled 2018-12-11: qty 1

## 2018-12-11 MED ORDER — OXYCODONE HCL 5 MG PO TABS: 5.0000 mg | ORAL_TABLET | Freq: Once | ORAL | Status: DC | PRN

## 2018-12-11 MED ORDER — GABAPENTIN 300 MG PO CAPS
600.0000 mg | ORAL_CAPSULE | Freq: Every day | ORAL | Status: DC
  Administered 2018-12-11: 600 mg via ORAL
  Filled 2018-12-11: qty 2

## 2018-12-11 MED ORDER — CHLORHEXIDINE GLUCONATE CLOTH 2 % EX PADS
6.0000 | MEDICATED_PAD | Freq: Every day | CUTANEOUS | Status: DC
  Administered 2018-12-12: 6 via TOPICAL

## 2018-12-11 MED ORDER — BUPIVACAINE LIPOSOME 1.3 % IJ SUSP
20.0000 mL | Freq: Once | INTRAMUSCULAR | Status: AC
  Administered 2018-12-11: 20 mL
  Filled 2018-12-11: qty 20

## 2018-12-11 MED ORDER — FENTANYL CITRATE (PF) 100 MCG/2ML IJ SOLN
INTRAMUSCULAR | Status: DC | PRN
  Administered 2018-12-11: 100 ug via INTRAVENOUS
  Administered 2018-12-11 (×3): 50 ug via INTRAVENOUS

## 2018-12-11 MED ORDER — LABETALOL HCL 5 MG/ML IV SOLN
INTRAVENOUS | Status: AC
  Administered 2018-12-11: 12:00:00 5 mg via INTRAVENOUS
  Filled 2018-12-11: qty 4

## 2018-12-11 MED ORDER — CHLORHEXIDINE GLUCONATE 4 % EX LIQD: Freq: Once | CUTANEOUS | Status: DC

## 2018-12-11 MED ORDER — BUPIVACAINE-EPINEPHRINE 0.5% -1:200000 IJ SOLN
INTRAMUSCULAR | Status: DC | PRN
  Administered 2018-12-11: 45 mL

## 2018-12-11 MED ORDER — TRAMADOL HCL 50 MG PO TABS
50.0000 mg | ORAL_TABLET | Freq: Four times a day (QID) | ORAL | Status: DC | PRN
  Administered 2018-12-12: 50 mg via ORAL
  Filled 2018-12-11: qty 1

## 2018-12-11 MED ORDER — CEFAZOLIN SODIUM-DEXTROSE 2-4 GM/100ML-% IV SOLN
2.0000 g | INTRAVENOUS | Status: AC
  Administered 2018-12-11: 2 g via INTRAVENOUS
  Filled 2018-12-11: qty 100

## 2018-12-11 MED ORDER — LIDOCAINE HCL (CARDIAC) PF 100 MG/5ML IV SOSY
PREFILLED_SYRINGE | INTRAVENOUS | Status: DC | PRN
  Administered 2018-12-11: 80 mg via INTRAVENOUS

## 2018-12-11 MED ORDER — TRAMADOL HCL 50 MG PO TABS: 50.0000 mg | ORAL_TABLET | Freq: Four times a day (QID) | ORAL | 0 refills | Status: DC | PRN

## 2018-12-11 MED ORDER — FENTANYL CITRATE (PF) 100 MCG/2ML IJ SOLN
INTRAMUSCULAR | Status: AC
  Administered 2018-12-11: 12:00:00 50 ug via INTRAVENOUS
  Filled 2018-12-11: qty 4

## 2018-12-11 MED ORDER — PROPOFOL 10 MG/ML IV BOLUS
INTRAVENOUS | Status: AC
  Filled 2018-12-11: qty 20

## 2018-12-11 MED ORDER — LABETALOL HCL 5 MG/ML IV SOLN
5.0000 mg | Freq: Once | INTRAVENOUS | Status: AC
  Administered 2018-12-11: 12:00:00 5 mg via INTRAVENOUS

## 2018-12-11 MED ORDER — ROCURONIUM BROMIDE 10 MG/ML (PF) SYRINGE
PREFILLED_SYRINGE | INTRAVENOUS | Status: AC
  Filled 2018-12-11: qty 10

## 2018-12-11 MED ORDER — ROSUVASTATIN CALCIUM 10 MG PO TABS
10.0000 mg | ORAL_TABLET | Freq: Every day | ORAL | Status: DC
  Administered 2018-12-11: 10 mg via ORAL
  Filled 2018-12-11: qty 1

## 2018-12-11 MED ORDER — FENTANYL CITRATE (PF) 250 MCG/5ML IJ SOLN
INTRAMUSCULAR | Status: AC
  Filled 2018-12-11: qty 5

## 2018-12-11 SURGICAL SUPPLY — 62 items
ADH SKN CLS APL DERMABOND .7 (GAUZE/BANDAGES/DRESSINGS) ×2
APL PRP STRL LF DISP 70% ISPRP (MISCELLANEOUS) ×2
APL SRG 38 LTWT LNG FL B (MISCELLANEOUS) ×2
APPLICATOR ARISTA FLEXITIP XL (MISCELLANEOUS) ×1 IMPLANT
CATH FOLEY 2WAY SLVR 18FR 30CC (CATHETERS) ×3 IMPLANT
CATH TIEMANN FOLEY 18FR 5CC (CATHETERS) ×3 IMPLANT
CHLORAPREP W/TINT 26 (MISCELLANEOUS) ×3 IMPLANT
CLIP VESOLOCK LG 6/CT PURPLE (CLIP) ×6 IMPLANT
COVER SURGICAL LIGHT HANDLE (MISCELLANEOUS) ×3 IMPLANT
COVER TIP SHEARS 8 DVNC (MISCELLANEOUS) ×2 IMPLANT
COVER TIP SHEARS 8MM DA VINCI (MISCELLANEOUS) ×1
COVER WAND RF STERILE (DRAPES) IMPLANT
CUTTER ECHEON FLEX ENDO 45 340 (ENDOMECHANICALS) ×3 IMPLANT
DECANTER SPIKE VIAL GLASS SM (MISCELLANEOUS) ×3 IMPLANT
DERMABOND ADVANCED (GAUZE/BANDAGES/DRESSINGS) ×1
DERMABOND ADVANCED .7 DNX12 (GAUZE/BANDAGES/DRESSINGS) ×2 IMPLANT
DRAIN CHANNEL RND F F (WOUND CARE) ×1 IMPLANT
DRAPE ARM DVNC X/XI (DISPOSABLE) ×8 IMPLANT
DRAPE COLUMN DVNC XI (DISPOSABLE) ×2 IMPLANT
DRAPE DA VINCI XI ARM (DISPOSABLE) ×4
DRAPE DA VINCI XI COLUMN (DISPOSABLE) ×1
DRAPE SURG IRRIG POUCH 19X23 (DRAPES) ×3 IMPLANT
DRSG TEGADERM 4X4.75 (GAUZE/BANDAGES/DRESSINGS) ×3 IMPLANT
ELECT REM PT RETURN 15FT ADLT (MISCELLANEOUS) ×3 IMPLANT
GAUZE 4X4 16PLY RFD (DISPOSABLE) IMPLANT
GAUZE SPONGE 2X2 8PLY STRL LF (GAUZE/BANDAGES/DRESSINGS) IMPLANT
GLOVE BIO SURGEON STRL SZ 6.5 (GLOVE) ×3 IMPLANT
GLOVE BIOGEL M STRL SZ7.5 (GLOVE) ×6 IMPLANT
GOWN STRL REUS W/TWL LRG LVL3 (GOWN DISPOSABLE) ×3 IMPLANT
GOWN STRL REUS W/TWL XL LVL3 (GOWN DISPOSABLE) ×6 IMPLANT
HEMOSTAT ARISTA ABSORB 3G PWDR (HEMOSTASIS) ×1 IMPLANT
HOLDER FOLEY CATH W/STRAP (MISCELLANEOUS) ×3 IMPLANT
IRRIG SUCT STRYKERFLOW 2 WTIP (MISCELLANEOUS) ×3
IRRIGATION SUCT STRKRFLW 2 WTP (MISCELLANEOUS) ×2 IMPLANT
IV LACTATED RINGERS 1000ML (IV SOLUTION) ×2 IMPLANT
KIT TURNOVER KIT A (KITS) ×1 IMPLANT
PACK ROBOT UROLOGY CUSTOM (CUSTOM PROCEDURE TRAY) ×3 IMPLANT
PAD POSITIONING PINK XL (MISCELLANEOUS) ×3 IMPLANT
PORT ACCESS TROCAR AIRSEAL 12 (TROCAR) ×2 IMPLANT
PORT ACCESS TROCAR AIRSEAL 5M (TROCAR)
RELOAD STAPLE 45 4.1 GRN THCK (STAPLE) ×2 IMPLANT
SEAL CANN UNIV 5-8 DVNC XI (MISCELLANEOUS) ×8 IMPLANT
SEAL XI 5MM-8MM UNIVERSAL (MISCELLANEOUS) ×4
SET TRI-LUMEN FLTR TB AIRSEAL (TUBING) ×2 IMPLANT
SOLUTION ELECTROLUBE (MISCELLANEOUS) ×3 IMPLANT
SPONGE GAUZE 2X2 STER 10/PKG (GAUZE/BANDAGES/DRESSINGS)
STAPLE RELOAD 45 GRN (STAPLE) ×2 IMPLANT
STAPLE RELOAD 45MM GREEN (STAPLE) ×3
SUT ETHILON 3 0 PS 1 (SUTURE) ×3 IMPLANT
SUT MNCRL AB 4-0 PS2 18 (SUTURE) ×6 IMPLANT
SUT V-LOC BARB 180 2/0GR6 GS22 (SUTURE) ×3
SUT VIC AB 0 CT1 27 (SUTURE) ×3
SUT VIC AB 0 CT1 27XBRD ANTBC (SUTURE) ×2 IMPLANT
SUT VIC AB 2-0 SH 27 (SUTURE) ×3
SUT VIC AB 2-0 SH 27X BRD (SUTURE) ×2 IMPLANT
SUT VICRYL 0 UR6 27IN ABS (SUTURE) ×6 IMPLANT
SUT VLOC BARB 180 ABS3/0GR12 (SUTURE) ×6
SUTURE V-LC BRB 180 2/0GR6GS22 (SUTURE) ×2 IMPLANT
SUTURE VLOC BRB 180 ABS3/0GR12 (SUTURE) ×4 IMPLANT
TOWEL OR NON WOVEN STRL DISP B (DISPOSABLE) ×3 IMPLANT
TUBING INSUF HEATED (TUBING) ×1 IMPLANT
WATER STERILE IRR 1000ML POUR (IV SOLUTION) ×3 IMPLANT

## 2018-12-11 NOTE — Discharge Instructions (Signed)
1. Activity:  You are encouraged to ambulate frequently (about every hour during waking hours) to help prevent blood clots from forming in your legs or lungs.  However, you should not engage in any heavy lifting (> 10-15 lbs), strenuous activity, or straining. 2. Diet: You should continue a clear liquid diet until passing gas from below.  Once this occurs, you may advance your diet to a soft diet that would be easy to digest (i.e soups, scrambled eggs, mashed potatoes, etc.) for 24 hours just as you would if getting over a bad stomach flu.  If tolerating this diet well for 24 hours, you may then begin eating regular food.  It will be normal to have some amount of bloating, nausea, and abdominal discomfort intermittently. 3. Prescriptions:  You will be provided a prescription for pain medication to take as needed.  If your pain is not severe enough to require the prescription pain medication, you may take Tylenol instead.  You should also take an over the counter stool softener (Colace 100 mg twice daily) to avoid straining with bowel movements as the pain medication may constipate you. Finally, you will also be provided a prescription for an antibiotic to begin the day prior to your return visit in the office for catheter removal. 4. Catheter care: You will be taught how to take care of the catheter by the nursing staff prior to discharge from the hospital.  You may use both a leg bag and the larger bedside bag but it is recommended to at least use the bigger bedside bag at nighttime as the leg bag is small and will fill up overnight and also does not drain as well when lying flat. You may periodically feel a strong urge to void with the catheter in place.  This is a bladder spasm and most often can occur when having a bowel movement or when you are moving around. It is typically self-limited and usually will stop after a few minutes.  You may use some Vaseline or Neosporin around the tip of the catheter to  reduce friction at the tip of the penis. 5. Incisions: You may remove your dressing bandages the 2nd day after surgery.  You most likely will have a few small staples in each of the incisions and once the bandages are removed, the incisions may stay open to air.  You may start showering (not soaking or bathing in water) 48 hours after surgery and the incisions simply need to be patted dry after the shower.  No additional care is needed. 6. What to call us about: You should call the office [phone]) if you develop fever > 101, persistent vomiting, or the catheter stops draining. Also, feel free to call with any other questions you may have and remember the handout that was provided to you as a reference preoperatively which answers many of the common questions that arise after surgery. 7. You may resume aspirin, advil, aleve, vitamins, and supplements 7 days after surgery.  You may resume aspirin, advil, aleve, vitamins, and supplements 7 days after surgery.  You may resume Plavix on Sat (12/14/18).

## 2018-12-11 NOTE — Anesthesia Procedure Notes (Signed)
Procedure Name: Intubation Date/Time: 12/11/2018 7:46 AM Performed by: [place] Aidel Davisson C, CRNA Pre-anesthesia Checklist: Patient identified, Emergency Drugs available, Suction available and Patient being monitored Patient Re-evaluated:Patient Re-evaluated prior to induction Oxygen Delivery Method: Circle system utilized Preoxygenation: Pre-oxygenation with 100% oxygen Induction Type: IV induction Ventilation: Mask ventilation without difficulty Laryngoscope Size: Miller Grade View: Grade I Tube type: Oral Tube size: 7.5 mm Number of attempts: 1 Airway Equipment and Method: Stylet and Oral airway Placement Confirmation: ETT inserted through vocal cords under direct vision,  positive ETCO2 and breath sounds checked- equal and bilateral Secured at: 22 cm Tube secured with: Tape Dental Injury: Teeth and Oropharynx as per pre-operative assessment

## 2018-12-11 NOTE — Progress Notes (Signed)
Patient ID: [name], male   DOB: Feb 04, 1956, 63 y.o.   MRN: [id number]  Post-op note  Subjective: The patient is doing well.  No complaints except dry mouth and mild bladder spasms.  Objective: Vital signs in last 24 hours: Temp:  [97.2 F (36.2 C)-98.4 F (36.9 C)] 97.7 F (36.5 C) (09/23 1314) Pulse Rate:  [89-97] 89 (09/23 1314) Resp:  [12-22] 16 (09/23 1314) BP: (145-206)/(78-112) 167/90 (09/23 1314) SpO2:  [98 %-100 %] 99 % (09/23 1314) Weight:  [103.6 kg] 103.6 kg (09/23 0554)  Intake/Output from previous day: No intake/output data recorded. Intake/Output this shift: Total I/O In: 1518 [I.V.:1418; IV Piggyback:100] Out: 530 [Urine:25; Drains:55; Blood:450]  Physical Exam:  General: Alert and oriented. Abdomen: Soft, Nondistended. Incisions: Clean and dry. Urine: dark yellow  Lab Results: Recent Labs    12/11/18 1217  HGB 13.0  HCT 38.2*    Assessment/Plan: POD#0   1) Continue to monitor  2) DVT prophy, clears, IS, amb, pain control  3) B/O for spasms   LOS: 0 days   Debbrah Alar 12/11/2018, 2:00 PM

## 2018-12-11 NOTE — H&P (Signed)
The patient follows up today to discuss treatment options for his recent diagnosis of prostate cancer. The patient has recovered well from his biopsy in is no longer having any hematuria.   PSA 3.8  Prostate cancer nomogram after radical prostatectomy:  PFS: 46yr- 71%, 116yr 56%  OC: 46%  ECE: 50%  LNI: 10%  SVI: 10%   TRUS: 42.42 grams, hypoechoic right lateral base, small median lobe  7/12 cores positive: Gleason 4+3=7 right/left base (4 cores)   MRI - Left lesion close to nerve, right lesion smaller and more central to nerve.   SHIM 14   Interval: The patient presents today after undergoing an MRI of his prostate to evaluate the proximity of the nerve to the lesions recently diagnosis Gleason 4+3=7 cancer, and to ensure no extracapsular extension or local progression. He denies any significant worsening of his lower urinary tract symptoms. He seems to be tolerating the low testosterone without issue. The patient has met with physical therapy and is scheduled for surgery in 3 weeks.     ALLERGIES: No Allergies    MEDICATIONS: Depo-Testosterone 200 mg/ml vial 0.6 ml IM Q1WK  Sildenafil Citrate 20 mg tablet 2-5 tablets daily as needed  Aspirin 81 MG TABS Oral  Clopidogrel 75 mg tablet Oral  Crestor 10 MG Oral Tablet Oral  Keppra  Levetiracetam 750 mg tablet Oral  Metformin Hcl 500 mg tablet Oral  Metoprolol Succinate 25 mg tablet, extended release 24 hr Oral  Sildenafil Citrate 20 mg tablet 2-5 tablets PRN  Sildenafil Citrate 20 mg tablet 2-5 tablets PRN  Sildenafil Citrate 100 mg tablet 1 tablet PO Daily PRN  Soliqua 100-33 100 unit-33 mcg/ml (3 ml) insulin pen  Synthroid 150 mcg tablet Oral     GU PSH: Prostate Needle Biopsy - 10/01/2018       PSH Notes: Cath Stent Placement, Brain Surgery   NON-GU PSH: Surgical Pathology, Gross And Microscopic Examination For Prostate Needle - 10/01/2018     GU PMH: Stress Incontinence - 11/12/2018 Prostate Cancer -  10/17/2018 Primary hypogonadism - 09/02/2018, - 02/28/2018, - 06/14/2017, - 12/11/2016, - 2018 (Stable, Chronic), The patient remains symptomatic and his testosterone remains low despite supplementation. We'll plan to increase the dose and see how he does., - 2017, Hypogonadism, testicular, - 2017, Hypogonadism, - 2014, Hypogonadism, - 2014 Encounter for Prostate Cancer screening, Prostate cancer screening - 2015 BPH w/o LUTS, Benign prostate hyperplasia - 2015, Benign prostate hyperplasia, - 2015, Benign prostatic hypertrophy without lower urinary tract symptoms, - 2014 Elevated PSA, Elevated prostate specific antigen (PSA) - 2015 Nocturia, Nocturia - 2015      PMH Notes:  2012-03-08 14:56:36 - Note: Acute Myocardial Infarction  2012-03-08 14:56:36 - Note: Cerebral Vasculitis  2012-03-08 14:56:36 - Note: Seizure   NON-GU PMH: Muscle weakness (generalized) - 11/12/2018, - 11/04/2018 Other muscle spasm - 11/12/2018 Encounter for general adult medical examination without abnormal findings, Encounter for preventive health examination - 2015 Personal history of other endocrine, nutritional and metabolic disease, History of diabetes mellitus - 2014    FAMILY HISTORY: Acute Myocardial Infarction - Runs In Family Death In The Family Mother - Runs In Family Family Health Status Number - Runs In Family   SOCIAL HISTORY: Marital Status: Divorced Preferred Language: English; Ethnicity: Not Hispanic Or Latino; Race: White     Notes: Caffeine Use, Marital History - Divorced, Never A Smoker, Occupation:, Alcohol Use   REVIEW OF SYSTEMS:    GU Review Male:   Patient denies  frequent urination, hard to postpone urination, burning/ pain with urination, get up at night to urinate, leakage of urine, stream starts and stops, trouble starting your stream, have to strain to urinate , erection problems, and penile pain.  Gastrointestinal (Upper):   Patient denies nausea, vomiting, and indigestion/ heartburn.   Gastrointestinal (Lower):   Patient denies diarrhea and constipation.  Constitutional:   Patient denies fever, night sweats, weight loss, and fatigue.  Skin:   Patient denies skin rash/ lesion and itching.  Eyes:   Patient denies blurred vision and double vision.  Ears/ Nose/ Throat:   Patient denies sore throat and sinus problems.  Hematologic/Lymphatic:   Patient denies easy bruising and swollen glands.  Cardiovascular:   Patient denies leg swelling and chest pains.  Respiratory:   Patient denies cough and shortness of breath.  Endocrine:   Patient denies excessive thirst.  Musculoskeletal:   Patient denies back pain and joint pain.  Neurological:   Patient denies headaches and dizziness.  Psychologic:   Patient denies depression and anxiety.   VITAL SIGNS:      11/18/2018 03:18 PM  Weight 228 lb / 103.42 kg  Height 71 in / 180.34 cm  BP 161/102 mmHg  Pulse 86 /min  Temperature 96.9 F / 36.0 C  BMI 31.8 kg/m   MULTI-SYSTEM PHYSICAL EXAMINATION:    Constitutional: Well-nourished. No physical deformities. Normally developed. Good grooming.  Respiratory: Normal breath sounds. No labored breathing, no use of accessory muscles.   Cardiovascular: Regular rate and rhythm. No murmur, no gallop. Normal temperature, normal extremity pulses, no swelling, no varicosities.      PAST DATA REVIEWED:  Source Of History:  Patient  Records Review:   Pathology Reports, Previous Doctor Records, Previous Patient Records, POC Tool  X-Ray Review: MRI Prostate GSORAD: Reviewed Films. Discussed With Patient.     08/26/18 02/21/18 06/07/17 12/04/16 05/31/16 11/23/15 05/19/15 10/06/14  PSA  Total PSA 3.83 ng/mL 2.96 ng/mL 2.64 ng/mL 1.85 ng/mL 1.52 ng/dl 0.67 ng/dl 0.99  1.39     08/26/18 02/21/18 06/07/17 12/04/16 05/31/16 01/14/16 11/23/15 08/25/15  Hormones  Testosterone, Total 1165.7 ng/dL 326.0 ng/dL 667.1 ng/dL 347.0 ng/dL 574.9 pg/dL 70.1 pg/dL 136.0 pg/dL 27.1 pg/dL    PROCEDURES: None    ASSESSMENT:      ICD-10 Details  1 GU:   Prostate Cancer - C61    PLAN:           Schedule Return Visit/Planned Activity: Keep Scheduled Appointment - Schedule Surgery          Document Letter(s):  Created for Patient: Clinical Summary         Notes:   I reviewed the MRI with the patient which demonstrates no evidence of extracapsular disease or local progression of his cancer. I did note that the lesion on the patient's left side is adjacent to the nerve for erectile function, and is in too tight for [place] to be able to spare it in an oncologically sound manner. However, the nerve on the right-hand side does seem to be far enough away from the apparent right-sided lesion and thus would be appropriate for [place] to tried spare.   Given this, the patient will likely have ongoing erectile dysfunction, but over the course of his recovery may well still be able to obtain erections on his own. However, likely he will need medication at the minimum.   We also discussed other details of the operation and I answered all his questions. He otherwise is  ready to go for September 23rd.

## 2018-12-11 NOTE — Anesthesia Postprocedure Evaluation (Signed)
Anesthesia Post Note  Patient: [name]  Procedure(s) Performed: XI ROBOTIC ASSISTED LAPAROSCOPIC RADICAL PROSTATECTOMY (N/A ) PELVIC LYMPH NODE DISSECTION (Bilateral )     Patient location during evaluation: PACU Anesthesia Type: General Level of consciousness: awake and alert Pain management: pain level controlled Vital Signs Assessment: post-procedure vital signs reviewed and stable Respiratory status: spontaneous breathing, nonlabored ventilation and respiratory function stable Cardiovascular status: blood pressure returned to baseline and stable Postop Assessment: no apparent nausea or vomiting Anesthetic complications: no    Last Vitals:  Vitals:   12/11/18 1235 12/11/18 1245  BP: (!) 165/83 (!) 170/85  Pulse:  90  Resp:  14  Temp:    SpO2:  100%    Last Pain:  Vitals:   12/11/18 1245  TempSrc:   PainSc: [name]

## 2018-12-11 NOTE — Transfer of Care (Signed)
Immediate Anesthesia Transfer of Care Note  Patient: [name]  Procedure(s) Performed: XI ROBOTIC ASSISTED LAPAROSCOPIC RADICAL PROSTATECTOMY (N/A ) PELVIC LYMPH NODE DISSECTION (Bilateral )  Patient Location: PACU  Anesthesia Type:General  Level of Consciousness: awake, alert  and oriented  Airway & Oxygen Therapy: Patient Spontanous Breathing and Patient connected to face mask oxygen  Post-op Assessment: stable  Post vital signs: Reviewed and stable  Last Vitals:  Vitals Value Taken Time  BP    Temp    Pulse 97 12/11/18 1153  Resp 19 12/11/18 1153  SpO2 100 % 12/11/18 1153  Vitals shown include unvalidated device data.  Last Pain:  Vitals:   12/11/18 0556  TempSrc: Oral  PainSc:       Patients Stated Pain Goal: 4 (0000000 [id number])  Complications: No apparent anesthesia complications

## 2018-12-11 NOTE — Progress Notes (Signed)
Inpatient Diabetes Program Recommendations  AACE/ADA: New Consensus Statement on Inpatient Glycemic Control (2015)  Target Ranges:  Prepandial:   less than 140 mg/dL      Peak postprandial:   less than 180 mg/dL (1-2 hours)      Critically ill patients:  140 - 180 mg/dL   Lab Results  Component Value Date   GLUCAP 264 (H) 12/11/2018   HGBA1C 9.7 (H) 12/04/2018    Review of Glycemic Control Results for [name], [name] (MRN [id number]) as of 12/11/2018 13:12  Ref. Range 12/11/2018 05:54 12/11/2018 11:54 12/11/2018 13:01  Glucose-Capillary Latest Ref Range: 70 - 99 mg/dL 185 (H) 299 (H)  Novolog 5 units given 264 (H)   Diabetes history: DM 2 Outpatient Diabetes medications: Soliqua 40 units qhs, Metformin 1000 mg bid Current orders for Inpatient glycemic control:  Novolog 0-15 units tid  Inpatient Diabetes Program Recommendations:    Glucose 200 range. Pt postop. Patient is on [place] 40 at home (combination of Lantus and Lixisenatide).  Please consider Lantus 20 units if admitted.  Agree with Novolog correction scale  Thanks,  Tama Headings RN, MSN, BC-ADM Inpatient Diabetes Coordinator Team Pager [phone] (8a-5p)

## 2018-12-11 NOTE — Interval H&P Note (Signed)
History and Physical Interval Note:  12/11/2018 7:31 AM  [name]  has presented today for surgery, with the diagnosis of PROSTATE CANCER.  The various methods of treatment have been discussed with the patient and family. After consideration of risks, benefits and other options for treatment, the patient has consented to  Procedure(s): XI ROBOTIC ASSISTED LAPAROSCOPIC RADICAL PROSTATECTOMY (N/A) PELVIC LYMPH NODE DISSECTION (Bilateral) as a surgical intervention.  The patient's history has been reviewed, patient examined, no change in status, stable for surgery.  I have reviewed the patient's chart and labs.  Questions were answered to the patient's satisfaction.     Ardis Hughs

## 2018-12-11 NOTE — Op Note (Signed)
Preoperative diagnosis:  1. Prostate Cancer   Postoperative diagnosis:  1. same   Procedure: 1. Robotic assisted laparoscopic radical prostatectomy - right sided nerve spare 2. Bilateral pelvic lymph node dissection   Surgeon: Ardis Hughs, MD First Assistant: Debbrah Alar, PA  Anesthesia: General  Complications: None  Intraoperative findings: #1 large obturator nodes on both sides noted and removed during lymph node dissection. #2 extended node dissection on left side up to common iliac bifurcation. #3 long urethral stub and tight vesicourethral anastamosis #4 right nerve spare #5 frozen sections sent at right posterior base and right posterio-lateral base which were read as negative.  EBL: 450cc  Specimens:  #1.  Prostate and seminal vesicals #2.  Bilateral pelvic lymph nodes #3. Right posterior base and right posterior-lateral base frozen sections - read as negative.   Indication: [name] is a 63 y.o. patient with prostate cancer.  After reviewing the management options for treatment, he elected to proceed with the removal of his prostate. We have discussed the potential benefits and risks of the procedure, side effects of the proposed treatment, the likelihood of the patient achieving the goals of the procedure, and any potential problems that might occur during the procedure or recuperation. Informed consent has been obtained.  Description of procedure: An assistant was required for this surgical procedure.  The duties of the assistant included but were not limited to suctioning, passing suture, camera manipulation, retraction. This procedure would not be able to be performed without an Environmental consultant.  The patient was consented in the preoperative holding area. He was in brought back to the operating room placed the table in supine position. General anesthesia was then induced and endotracheal tube was inserted. He was then placed in dorsolithotomy position and  placed in steep Trendelenburg. He was then prepped and draped in the routine sterile fashion. We, the first assistant and I, then began by making a 10 mm incision supraumbilical midline incision the skin down through into the peritoneum. Then placed a 8 mm trocar. I then inflated the abdomen and inserted the 0 robotic lens. We then placed 2 additional a 8 millimeter trochars in the patient's left lower abdomen proximally 9 cm apart and 2 trochars on the patient's right lower abdomen, one was an 8 mm trocar and the one most lateral was a 12 mm trocar which was used as the assistant port. A 5 mm trocar was placed by triangulating the 2 right lateral ports as a second assistant port. These ports were all placed under visual guidance. Once the ports were noted to be satisfactory position the robot was docked. We started with the 0 lens, monopolar scissors in the right hand and the [place] forceps the left hand as well as a fenestrated grasper as the third arm on the left-hand side.   We, the first assistant and I,  began our dissection the posterior plane incising the peritoneum at the level of the vas deferens. Isolated the left vas deferens and dissected it proximally towards the spermatic cord for 5 cm prior to ligating it. Then used this as traction to isolate the left the seminal vesicle which was then undressed bluntly and completely dissected out, all vessels were cauterized with a combination of bipolar and the monopolar scissors. We then turned our attention to the right side and similarly dissected out the right vas deferens and seminal vesicle. Once the SVs had been freed, we turned our attention to the posterior plane and bluntly dissected the  tissue between the rectum and the posterior wall of the prostate bluntly out towards the apex.    At this point the bladder was taken down starting at the urachal remnant with a combination of both blunt dissection and sharp dissection using monopolar cautery  the bladder was dropped down in the usual fashion to the medial umbilical ligaments laterally and the dorsal vein of the prostate anteriorly creating our space of Retzius. We then turned our attention to the endopelvic fascia which was incised laterally starting on the patient's right-hand side the levator muscles were pushed off the prostate laterally up towards the dorsal vein complex on the right-hand side. This process was then repeated on the left-hand side and a nice notch was created for the dorsal vein. I then used a 68mm stapler to staple the dorsal vein.   We,the first assistant and I, then located the bladder neck at the vesicoprostatic junction and using the monopolar scissors dissected down through the perivesical tissues and the bladder neck down to the prostatic urethra. The catheter was then deflated and pulled through our urethral opening and then used to retract the prostate anteriorly for the posterior bladder neck dissection. Once through the bladder neck and into the posterior plane of the prostate, the SVs were brought through the opening. The left pedicle was then isolated and systematically ligated with Weck clips and scissors. The nerve bundle was not spared.  I then worked my way  Through the right pedicle, and the nerve bundle was then peeled off the posterior lateral aspect of the prostate and bluntly dissected away off the prostate.    I then came down through the dorsal venous complex anteriorly down to the membranous urethra using the monopolar. Once down to the urethra, it was transected sharply and the apex of the prostate was then dissected off the levator and rectourethralis muscles. Once the apex of the prostate had been dissected free we came back to the base of the prostate and bluntly push the rectum and nerve vascular bundle off the prostate the patient's left and used clips on the patient's right to free the prostate. Once the prostate was free it was placed off to the  side. The pelvis was then irrigated with normal saline and noted to be relatively hemostatic.  Attention was then turned to the right pelvic sidewall. The fibrofatty tissue between the external iliac vein, confluence of the iliac vessels, hypogastric artery, and Cooper's ligament was dissected free from the pelvic sidewall with care to preserve the obturator nerve. Weck clips were used for lymphostasis and hemostasis. An identical procedure was performed on the contralateral side and the lymphatic packets were removed for permanent pathologic analysis.  The prostate and both lymph node tissues were placed in the Endo Catch bag and the string brought to the 5 mm port.    The vesicourethral anastomosis was then completed with 2 interlocking 3-0 V. lock sutures running the anastomosis in the 6:00 position to the 12:00 position on each side and then tying it off on the top. The final catheter was then passed through the patient's urethra and into the bladder and 120 cc was instilled into the bladder to test the anastomosis. As there was no leak a 64 [place] Blake drain was passed through the left lateral port and placed around the vesicourethral anastomosis. A 12 mm assistant port on the right lateral side was then closed with 0 Vicryl with the help of the [employer] needle. The 12 mm  midline infraumbilical incision was then extended another centimeter taken down and the fascia opened to remove the Endo Catch bag with the prostate specimen. The fascia was then closed with a 0 Vicryl and all skin ports were closed with 4-0 Monocryl in a subcutaneous fashion. Dermabond glue was then applied to the incisions. The drain was then secured to the skin with a 0 nylon stitch and dressing applied.   At the end of the case all laps needles and sponges had been accounted for. There no immediate complications. The patient returned to the PACU in stable condition.

## 2018-12-12 ENCOUNTER — Encounter (HOSPITAL_COMMUNITY): Payer: Self-pay | Admitting: Urology

## 2018-12-12 DIAGNOSIS — C61 Malignant neoplasm of prostate: Secondary | ICD-10-CM | POA: Diagnosis not present

## 2018-12-12 LAB — HEMOGLOBIN AND HEMATOCRIT, BLOOD
HCT: 35.6 % — ABNORMAL LOW (ref 39.0–52.0)
Hemoglobin: 11.8 g/dL — ABNORMAL LOW (ref 13.0–17.0)

## 2018-12-12 LAB — BASIC METABOLIC PANEL
Anion gap: 8 (ref 5–15)
BUN: 25 mg/dL — ABNORMAL HIGH (ref 8–23)
CO2: 21 mmol/L — ABNORMAL LOW (ref 22–32)
Calcium: 8.1 mg/dL — ABNORMAL LOW (ref 8.9–10.3)
Chloride: 103 mmol/L (ref 98–111)
Creatinine, Ser: 1.43 mg/dL — ABNORMAL HIGH (ref 0.61–1.24)
GFR calc Af Amer: 60 mL/min — ABNORMAL LOW (ref >60)
GFR calc non Af Amer: 52 mL/min — ABNORMAL LOW (ref >60)
Glucose, Bld: 197 mg/dL — ABNORMAL HIGH (ref 70–99)
Potassium: 4.7 mmol/L (ref 3.5–5.1)
Sodium: 132 mmol/L — ABNORMAL LOW (ref 135–145)

## 2018-12-12 LAB — GLUCOSE, CAPILLARY: Glucose-Capillary: 158 mg/dL — ABNORMAL HIGH (ref 70–99)

## 2018-12-12 NOTE — Progress Notes (Signed)
Patient ambulated on hall way at this time, tolerated well. 

## 2018-12-12 NOTE — Progress Notes (Signed)
1 Day Post-Op Subjective: The patient is doing well.  No nausea or vomiting. Pain is adequately controlled.  Objective: Vital signs in last 24 hours: Temp:  [97.2 F (36.2 C)-98.6 F (37 C)] 97.9 F (36.6 C) (09/24 0622) Pulse Rate:  [75-97] 75 (09/24 0622) Resp:  [12-22] 20 (09/24 0622) BP: (113-206)/(62-112) 117/62 (09/24 0622) SpO2:  [95 %-100 %] 96 % (09/24 0622)  Intake/Output from previous day: 09/23 0701 - 09/24 0700 In: 3942.4 [P.O.:720; I.V.:2922.4; IV Piggyback:300] Out: 1790 [id number]; Drains:195; Blood:450] Intake/Output this shift: No intake/output data recorded.  Physical Exam:  General: Alert and oriented. GI: Soft, Nondistended. Incisions: Dressings intact. Urine: Clear Extremities: Nontender, no erythema, no edema.  Lab Results: Recent Labs    12/11/18 1217 12/12/18 0531  HGB 13.0 11.8*  HCT 38.2* 35.6*   Recent Labs    12/12/18 0531  NA 132*  K 4.7  CL 103  CO2 21*  GLUCOSE 197*  BUN 25*  CREATININE 1.43*  CALCIUM 8.1*      Assessment/Plan: POD# 1 s/p robotic prostatectomy.  1) SL IVF 2) Ambulate, Incentive spirometry 3) Transition to oral pain medication 4) D/C pelvic drain 5) Plan for likely discharge later today   LOS: 0 days   Ardis Hughs 12/12/2018, 7:26 AM

## 2018-12-12 NOTE — Discharge Summary (Signed)
Date of admission: 12/11/2018  Date of discharge: 12/12/2018  Admission diagnosis: prostate cancer  Discharge diagnosis: same  Secondary diagnoses:  Patient Active Problem List   Diagnosis Date Noted  . Prostate cancer ([place]) 12/11/2018  . Diabetic amyotrophy ([place]) 01/08/2018  . Diabetic neuropathy ([place]) 01/08/2018  . Lumbar radiculopathy, right 04/18/2017  . OBESITY, UNSPECIFIED 01/07/2009  . OTHER SPECIFIED DISEASES OF PERICARDIUM 01/07/2009  . HYPOTHYROIDISM-IATROGENIC 02/26/2008  . HYPERLIPIDEMIA-MIXED 02/26/2008  . CAD, NATIVE VESSEL 02/26/2008    Procedures performed: Procedure(s): XI ROBOTIC ASSISTED LAPAROSCOPIC RADICAL PROSTATECTOMY PELVIC LYMPH NODE DISSECTION  History and Physical: For full details, please see admission history and physical. Briefly, [name] is a 63 y.o. year old patient with prostate cancer.   Hospital Course: Patient tolerated the procedure well.  He was then transferred to the floor after an uneventful PACU stay.  His hospital course was uncomplicated.  On POD#1 he had met discharge criteria: was eating a regular diet, was up and ambulating independently,  pain was well controlled, was voiding without a catheter, and was ready to for discharge.   Laboratory values:  Recent Labs    12/11/18 1217 12/12/18 0531  HGB 13.0 11.8*  HCT 38.2* 35.6*   Recent Labs    12/12/18 0531  NA 132*  K 4.7  CL 103  CO2 21*  GLUCOSE 197*  BUN 25*  CREATININE 1.43*  CALCIUM 8.1*   No results for input(s): LABPT, INR in the last 72 hours. No results for input(s): LABURIN in the last 72 hours. Results for orders placed or performed during the hospital encounter of 12/07/18  Novel Coronavirus, NAA (Hosp order, Send-out to Ref Lab; TAT 18-24 hrs     Status: None   Collection Time: 12/07/18  1:05 PM   Specimen: Nasopharyngeal Swab; Respiratory  Result Value Ref Range Status   SARS-CoV-2, NAA NOT DETECTED NOT DETECTED Final    Comment: (NOTE) This  nucleic acid amplification test was developed and its performance characteristics determined by [employer]. Nucleic acid amplification tests include PCR and TMA. This test has not been FDA cleared or approved. This test has been authorized by FDA under an Emergency Use Authorization (EUA). This test is only authorized for the duration of time the declaration that circumstances exist justifying the authorization of the emergency use of in vitro diagnostic tests for detection of SARS-CoV-2 virus and/or diagnosis of COVID-19 infection under section 564(b)(1) of the Act, 21 U.S.C. 161WRU-0(A) (1), unless the authorization is terminated or revoked sooner. When diagnostic testing is negative, the possibility of a false negative result should be considered in the context of a patient's recent exposures and the presence of clinical signs and symptoms consistent with COVID-19. An individual without symptoms of COVID- 19 and who is not shedding SARS-CoV-2 vi rus would expect to have a negative (not detected) result in this assay. Performed At: [facility] [address] [place], [place] [id number] Rush Farmer MD YN:[id number]    [place]  Final    Comment: Performed at [facility], [place] [address], [place], [place] [address]    Disposition: Home  Discharge instruction: The patient was instructed to be ambulatory but told to refrain from heavy lifting, strenuous activity, or driving.   Discharge medications:  Allergies as of 12/12/2018   No Known Allergies     Medication List    STOP taking these medications   clopidogrel 75 MG tablet Commonly known as: PLAVIX     TAKE  these medications   acetaminophen 500 MG tablet Commonly known as: TYLENOL Take 500 mg by mouth every 6 (six) hours as needed for mild pain.   Crestor 10 MG tablet Generic drug: rosuvastatin take 1 tablet by mouth at bedtime What changed: how much to take    gabapentin 300 MG capsule Commonly known as: NEURONTIN Take 1 capsule (300 mg total) by mouth 3 (three) times daily. What changed:   how much to take  when to take this   levETIRAcetam 750 MG tablet Commonly known as: KEPPRA Take 750 mg by mouth 2 (two) times daily.   levothyroxine 137 MCG tablet Commonly known as: SYNTHROID Take 137 mcg by mouth daily before breakfast.   metFORMIN 500 MG tablet Commonly known as: GLUCOPHAGE Take 1,000 mg by mouth 2 (two) times daily. 2 tabs twice a day   metoprolol succinate 25 MG 24 hr tablet Commonly known as: TOPROL-XL Take 25 mg by mouth daily.   SOLIQUA [place] Inject 40 Units into the skin at bedtime.   sulfamethoxazole-trimethoprim 800-160 MG tablet Commonly known as: BACTRIM DS Take 1 tablet by mouth 2 (two) times daily. Start the day prior to foley removal appointment   traMADol 50 MG tablet Commonly known as: Ultram Take 1-2 tablets (50-100 mg total) by mouth every 6 (six) hours as needed for moderate pain or severe pain.       Followup:  Follow-up Information    Jed Limerick, NP On 12/18/2018.   Specialty: Urology Why: at 10:00 Contact information: [address] [place] [address] [phone]

## 2018-12-17 LAB — SURGICAL PATHOLOGY

## 2018-12-18 ENCOUNTER — Other Ambulatory Visit: Payer: Self-pay | Admitting: Urology

## 2018-12-18 MED ORDER — TRAMADOL HCL 50 MG PO TABS: 50.0000 mg | ORAL_TABLET | Freq: Four times a day (QID) | ORAL | 0 refills | Status: DC | PRN

## 2019-04-20 NOTE — Progress Notes (Signed)
Cardiology Office Note   Date:  04/21/2019   ID:  [name], DOB 12/21/1955, MRN [id number]  PCP:  Burnard Bunting, MD  Cardiologist:   Dorris Carnes, MD    F?U of CAD      History of Present Illness: [name] is a 64 y.o. male with a history of CAD, HL, DM, pericardial cyst   He is status post ST elevation MI in April 2007 has 3 bare-metal stents to the circumflex. Repeat catheterization 2009 showed a 60-65% mid RCA stenosis; small RV branch with 80% stenosis. D2 had a 70% ostial lesion. D1 40%. The stents in the LCX had 50% instent proximally.LVEF 55% overall unchanged.   I saw the pt in Jan 2020  The patient said he is doing okay.  He is recovering from surgery for prostate cancer which she had earlier in the fall.  He is going through physical therapy and says he is progressing okay.  He denies chest tightness, shortness of breath, dizziness, lower extremity edema, palpitations.  He is not as active as he had been.  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  Just joined a gym that is due to open up down the block from his home.   Outpatient Medications Prior to Visit  Medication Sig Dispense Refill  . acetaminophen (TYLENOL) 500 MG tablet Take 500 mg by mouth every 6 (six) hours as needed for mild pain.    Marland Kitchen clopidogrel (PLAVIX) 75 MG tablet Take 75 mg by mouth daily.    . CRESTOR 10 MG tablet take 1 tablet by mouth at bedtime (Patient taking differently: Take 10 mg by mouth at bedtime. ) 30 tablet 3  . gabapentin (NEURONTIN) 300 MG capsule Take 1 capsule (300 mg total) by mouth 3 (three) times daily. (Patient taking differently: Take 600 mg by mouth at bedtime. ) 90 capsule 11  . Insulin Glargine-Lixisenatide (SOLIQUA [place]) Inject 40 Units into the skin at bedtime.     . levETIRAcetam (KEPPRA) 750 MG tablet Take 750 mg by mouth 2 (two) times daily.    Marland Kitchen levothyroxine (SYNTHROID, LEVOTHROID) 137 MCG tablet Take 137 mcg by mouth daily before  breakfast.    . metFORMIN (GLUCOPHAGE) 500 MG tablet Take 1,000 mg by mouth 2 (two) times daily. 2 tabs twice a day    . metoprolol succinate (TOPROL-XL) 25 MG 24 hr tablet Take 25 mg by mouth daily.      Marland Kitchen sulfamethoxazole-trimethoprim (BACTRIM DS) 800-160 MG tablet Take 1 tablet by mouth 2 (two) times daily. Start the day prior to foley removal appointment 6 tablet 0  . zolpidem (AMBIEN) 10 MG tablet Take 5-10 mg by mouth at bedtime as needed.    . traMADol (ULTRAM) 50 MG tablet Take 1-2 tablets (50-100 mg total) by mouth every 6 (six) hours as needed for moderate pain or severe pain. (Patient not taking: Reported on 04/21/2019) 20 tablet 0  . traMADol (ULTRAM) 50 MG tablet Take 1-2 tablets (50-100 mg total) by mouth every 6 (six) hours as needed for moderate pain. (Patient not taking: Reported on 04/21/2019) 20 tablet 0   No facility-administered medications prior to visit.     Allergies:   Patient has no known allergies.   Past Medical History:  Diagnosis Date  . Cancer [facility])    prostate  . Coronary artery disease   . Diabetes mellitus    type 2  . Diabetic amyotrophy ([place]) 01/08/2018   Right  leg  . Diabetic neuropathy ([place]) 01/08/2018   feet  . History of kidney stones   . Hyperlipidemia   . Other iatrogenic hypothyroidism   . Seizure [facility])    last seizure 2000 due to vasculitis    Past Surgical History:  Procedure Laterality Date  . BRAIN SURGERY  brain biopsy   2000 to figure out cause of headaches dx with vasculitis  . PELVIC LYMPH NODE DISSECTION Bilateral 12/11/2018   Procedure: PELVIC LYMPH NODE DISSECTION;  Surgeon: Ardis Hughs, MD;  Location: [facility];  Service: Urology;  Laterality: Bilateral;  . PROSTATE BIOPSY  11/1998  . ROBOT ASSISTED LAPAROSCOPIC RADICAL PROSTATECTOMY N/A 12/11/2018   Procedure: XI ROBOTIC ASSISTED LAPAROSCOPIC RADICAL PROSTATECTOMY;  Surgeon: Ardis Hughs, MD;  Location: [facility];  Service: Urology;  Laterality: N/A;  . rotater cuff  surgery Left 01/17/2017  . STENT TO HEART     X 1     Social History:  The patient  reports that he has never smoked. He has never used smokeless tobacco. He reports current alcohol use. He reports that he does not use drugs.   Family History:  The patient's family history includes Heart attack in his father and mother.    ROS:  Please see the history of present illness. All other systems are reviewed and  Negative to the above problem except as noted.    PHYSICAL EXAM: VS:  BP 138/84   Pulse 80   Ht 5' 11.5" (1.816 m)   Wt 222 lb 6.4 oz (100.9 kg)   BMI 30.59 kg/m   GEN: Obese 63 yo  in no acute distress  HEENT: normal  Neck: NEck Full  No obevious JVD  , carotid bruits Cardiac: RRR; no murmurs, rubs, or gallops,no edema  Respiratory:  clear to auscultation bilaterally, normal work of breathing GI: soft, nontender, nondistended, + BS  No hepatomegaly  MS: no deformity Moving all extremities   Skin: warm and dry, no rash Neuro:  Strength and sensation are intact Psych: euthymic mood, full affect   EKG:  EKG is  ordered today.  SR 80 bpm  IWMI     Lipid Panel    Component Value Date/Time   CHOL 104 04/19/2009 0837   TRIG 152.0 (H) 04/19/2009 0837   HDL 31.40 (L) 04/19/2009 0837   CHOLHDL 3 04/19/2009 0837   VLDL 30.4 04/19/2009 0837   LDLCALC 42 04/19/2009 0837      Wt Readings from Last 3 Encounters:  04/21/19 222 lb 6.4 oz (100.9 kg)  12/11/18 228 lb 5 oz (103.6 kg)  12/04/18 228 lb 5 oz (103.6 kg)      ASSESSMENT AND PLAN:  1  CAD   No symptoms of angina   Cath noted above from 2018   He remains on Plavix.      2  HL  Continue Crestor   LDL is very well controlled at 37.  HDL 33 would continue.  3  HTN  BP is good continue current meds   4.  Covid 19.  Discussed precautions for getting infection.  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  Encouraged him when the vaccine becomes available for his age group with known coronary  artery disease to go ahead and get vaccinated  F/U in 1 year    Current medicines are reviewed at length with the patient today.  The patient does not have concerns regarding medicines.  Signed, Dorris Carnes,  MD  04/21/2019 11:10 AM    [address], [place], [place]  [address] Phone: [phone]; Fax: [phone]

## 2019-04-21 ENCOUNTER — Encounter (INDEPENDENT_AMBULATORY_CARE_PROVIDER_SITE_OTHER): Payer: Self-pay

## 2019-04-21 ENCOUNTER — Ambulatory Visit: Payer: 59 | Admitting: Internal Medicine

## 2019-04-21 ENCOUNTER — Encounter: Payer: Self-pay | Admitting: Internal Medicine

## 2019-04-21 ENCOUNTER — Other Ambulatory Visit: Payer: Self-pay

## 2019-04-21 VITALS — BP 138/84 | HR 80 | Ht 71.5 in | Wt 222.4 lb

## 2019-04-21 DIAGNOSIS — I2510 Atherosclerotic heart disease of native coronary artery without angina pectoris: Secondary | ICD-10-CM | POA: Diagnosis not present

## 2019-04-21 NOTE — Patient Instructions (Signed)
Medication Instructions:  No changes *If you need a refill on your cardiac medications before your next appointment, please call your pharmacy*  Lab Work: none If you have labs (blood work) drawn today and your tests are completely normal, you will receive your results only by: Marland Kitchen MyChart Message (if you have MyChart) OR . A paper copy in the mail If you have any lab test that is abnormal or we need to change your treatment, we will call you to review the results.  Testing/Procedures: none  Follow-Up: At [facility], you and your health needs are our priority.  As part of our continuing mission to provide you with exceptional heart care, we have created designated Provider Care Teams.  These Care Teams include your primary Cardiologist (physician) and Advanced Practice Providers (APPs -  Physician Assistants and Nurse Practitioners) who all work together to provide you with the care you need, when you need it.  Your next appointment:   9 month(s)  The format for your next appointment:   Either In Person or Virtual  Provider:   You may see Dorris Carnes, MD or one of the following Advanced Practice Providers on your designated Care Team:    Richardson Dopp, PA-C  [place], [place]  Daune Perch, NP   Other Instructions

## 2020-01-19 ENCOUNTER — Encounter: Payer: Self-pay | Admitting: Internal Medicine

## 2020-01-19 ENCOUNTER — Other Ambulatory Visit: Payer: Self-pay

## 2020-01-19 ENCOUNTER — Ambulatory Visit: Payer: 59 | Admitting: Internal Medicine

## 2020-01-19 VITALS — BP 114/60 | HR 88 | Ht 71.0 in | Wt 217.4 lb

## 2020-01-19 DIAGNOSIS — I2510 Atherosclerotic heart disease of native coronary artery without angina pectoris: Secondary | ICD-10-CM | POA: Diagnosis not present

## 2020-01-19 DIAGNOSIS — E782 Mixed hyperlipidemia: Secondary | ICD-10-CM | POA: Diagnosis not present

## 2020-01-19 NOTE — Progress Notes (Signed)
Cardiology Office Note   Date:  01/19/2020   ID:  Oconnor Paul, DOB September 03, 1955, MRN [id number]  PCP:  Burnard Bunting, MD  Cardiologist:   Dorris Carnes, MD    F/U of CAD      History of Present Illness: [name] is a 64 y.o. male with a history of CAD, HL, DM, pericardial cyst   He is status post ST elevation MI in April 2007 has 3 bare-metal stents to the circumflex. Repeat catheterization 2009 showed a 60-65% mid RCA stenosis; small RV branch with 80% stenosis. D2 had a 70% ostial lesion. D1 40%. The stents in the LCX had 50% instent proximally.LVEF 55% overall unchanged.   I saw the pt in Feb 2021 SInce then he has completed Rx for prostate CA Hisbreathing is OK  He denies CP  No dizziness    Outpatient Medications Prior to Visit  Medication Sig Dispense Refill   acetaminophen (TYLENOL) 500 MG tablet Take 500 mg by mouth every 6 (six) hours as needed for mild pain.     clopidogrel (PLAVIX) 75 MG tablet Take 75 mg by mouth daily.     CRESTOR 10 MG tablet take 1 tablet by mouth at bedtime (Patient taking differently: Take 10 mg by mouth at bedtime. ) 30 tablet 3   gabapentin (NEURONTIN) 300 MG capsule Take 1 capsule (300 mg total) by mouth 3 (three) times daily. (Patient taking differently: Take 600 mg by mouth at bedtime. ) 90 capsule 11   Insulin Glargine-Lixisenatide (SOLIQUA [place]) Inject 40 Units into the skin at bedtime.      levETIRAcetam (KEPPRA) 750 MG tablet Take 750 mg by mouth 2 (two) times daily.     levothyroxine (SYNTHROID, LEVOTHROID) 137 MCG tablet Take 125 mcg by mouth daily before breakfast.      metFORMIN (GLUCOPHAGE) 500 MG tablet Take 1,000 mg by mouth 2 (two) times daily. 2 tabs twice a day     metoprolol succinate (TOPROL-XL) 25 MG 24 hr tablet Take 25 mg by mouth daily.       sulfamethoxazole-trimethoprim (BACTRIM DS) 800-160 MG tablet Take 1 tablet by mouth 2 (two) times daily. Start the day prior to foley removal appointment (Patient not  taking: Reported on 01/19/2020) 6 tablet 0   zolpidem (AMBIEN) 10 MG tablet Take 5-10 mg by mouth at bedtime as needed. (Patient not taking: Reported on 01/19/2020)     No facility-administered medications prior to visit.     Allergies:   Patient has no known allergies.   Past Medical History:  Diagnosis Date   Cancer [facility])    prostate   Coronary artery disease    Diabetes mellitus    type 2   Diabetic amyotrophy ([place]) 01/08/2018   Right leg   Diabetic neuropathy ([place]) 01/08/2018   feet   History of kidney stones    Hyperlipidemia    Other iatrogenic hypothyroidism    Seizure ([place])    last seizure 2000 due to vasculitis    Past Surgical History:  Procedure Laterality Date   BRAIN SURGERY  brain biopsy   2000 to figure out cause of headaches dx with vasculitis   PELVIC LYMPH NODE DISSECTION Bilateral 12/11/2018   Procedure: PELVIC LYMPH NODE DISSECTION;  Surgeon: Ardis Hughs, MD;  Location: [facility];  Service: Urology;  Laterality: Bilateral;   PROSTATE BIOPSY  11/1998   ROBOT ASSISTED LAPAROSCOPIC RADICAL PROSTATECTOMY N/A 12/11/2018   Procedure: XI ROBOTIC ASSISTED LAPAROSCOPIC RADICAL PROSTATECTOMY;  Surgeon:  Ardis Hughs, MD;  Location[facility];  Service: Urology;  Laterality: N/A;   rotater cuff surgery Left 01/17/2017   STENT TO HEART     X 1     Social History:  The patient  reports that he has never smoked. He has never used smokeless tobacco. He reports current alcohol use. He reports that he does not use drugs.   Family History:  The patient's family history includes Heart attack in his father and mother.    ROS:  Please see the history of present illness. All other systems are reviewed and  Negative to the above problem except as noted.    PHYSICAL EXAM: VS:  BP 114/60    Pulse 88    Ht 5\' 11"  (1.803 m)    Wt 217 lb 6.4 oz (98.6 kg)    SpO2 97%    BMI 30.32 kg/m   GEN: Obese 64 yo  in no acute distress  HEENT: normal  Neck: NEck  Full  No obvious JVD No bruits Cardiac: RRR; no murmurs.  No LE edema  Respiratory:  clear to auscultation bilaterally, normal work of breathing GI: soft, nontender, nondistended, + BS  No hepatomegaly  MS: no deformity Moving all extremities   Skin: warm and dry, no rash Neuro:  Strength and sensation are intact Psych: euthymic mood, full affect   EKG:  EKG not ordered    Lipid Panel    Component Value Date/Time   CHOL 104 04/19/2009 0837   TRIG 152.0 (H) 04/19/2009 0837   HDL 31.40 (L) 04/19/2009 0837   CHOLHDL 3 04/19/2009 0837   VLDL 30.4 04/19/2009 0837   LDLCALC 42 04/19/2009 0837      Wt Readings from Last 3 Encounters:  01/19/20 217 lb 6.4 oz (98.6 kg)  04/21/19 222 lb 6.4 oz (100.9 kg)  12/11/18 228 lb 5 oz (103.6 kg)      ASSESSMENT AND PLAN:  1  CAD   No symptoms of angina   Keep on same regimen    2  HL  Continue Crestor   Will have labs with Dr Reynaldo Minium  3  HTN  BP is well controlled on current meds  4  Obesity   Discussed diet and wt loss   Stay active   4.  Covid 19.  Discussed precautions for getting infection.  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  Encouraged him when the vaccine becomes available for his age group with known coronary artery disease to go ahead and get vaccinated  F/U in 1 year    Current medicines are reviewed at length with the patient today.  The patient does not have concerns regarding medicines.  Signed, Dorris Carnes, MD  01/19/2020 12:05 PM    [address], [place], [place]  [address] Phone: [phone]; Fax: [phone]

## 2020-01-19 NOTE — Patient Instructions (Addendum)
Medication Instructions:  No changes *If you need a refill on your cardiac medications before your next appointment, please call your pharmacy*   Lab Work: None ordered If you have labs (blood work) drawn today and your tests are completely normal, you will receive your results only by: Marland Kitchen MyChart Message (if you have MyChart) OR . A paper copy in the mail If you have any lab test that is abnormal or we need to change your treatment, we will call you to review the results.   Testing/Procedures: none   Follow-Up: At [facility], you and your health needs are our priority.  As part of our continuing mission to provide you with exceptional heart care, we have created designated Provider Care Teams.  These Care Teams include your primary Cardiologist (physician) and Advanced Practice Providers (APPs -  Physician Assistants and Nurse Practitioners) who all work together to provide you with the care you need, when you need it.  We recommend signing up for the patient portal called "MyChart".  Sign up information is provided on this After Visit Summary.  MyChart is used to connect with patients for Virtual Visits (Telemedicine).  Patients are able to view lab/test results, encounter notes, upcoming appointments, etc.  Non-urgent messages can be sent to your provider as well.   To learn more about what you can do with MyChart, go to [email or web address].    Your next appointment:   9 month(s)  The format for your next appointment:   In Person  Provider:   You may see Dorris Carnes, MD or one of the following Advanced Practice Providers on your designated Care Team:    Richardson Dopp, PA-C  [place], [place]    Other Instructions Addendum: 01/19/20 3:04 pm.  Copy of recent lab results from your primary care doctor have been requested.

## 2020-03-20 DIAGNOSIS — Z8616 Personal history of COVID-19: Secondary | ICD-10-CM

## 2020-03-20 HISTORY — DX: Personal history of COVID-19: Z86.16

## 2020-09-03 ENCOUNTER — Telehealth: Payer: Self-pay | Admitting: *Deleted

## 2020-09-03 NOTE — Telephone Encounter (Signed)
   [facility] Pre-operative Risk Assessment    Patient Name: [name]  DOB: 1955-06-24  MRN: [id number]   HEARTCARE STAFF: - Please ensure there is not already an duplicate clearance open for this procedure. - Under Visit Info/Reason for Call, type in Other and utilize the format Clearance MM/DD/YY or Clearance TBD. Do not use dashes or single digits. - If request is for dental extraction, please clarify the # of teeth to be extracted. - If the patient is currently at the dentist's office, call Pre-Op APP to address. If the patient is not currently in the dentist office, please route to the Pre-Op pool  Request for surgical clearance:  What type of surgery is being performed? LAP CHOLE   When is this surgery scheduled? TBD   What type of clearance is required (medical clearance vs. Pharmacy clearance to hold med vs. Both)? MEDICAL  Are there any medications that need to be held prior to surgery and how long? PLAVIX   Practice name and name of physician performing surgery? [facility]; DR. Greer Pickerel   What is the office phone number? [phone]   7.   What is the office fax number? [place]: MIKEYA DUNBAR, CMA  8.   Anesthesia type (None, local, MAC, general) ? GENERAL   Julaine Hua 09/03/2020, 4:28 PM  _________________________________________________________________   (provider comments below)

## 2020-09-06 NOTE — Telephone Encounter (Signed)
    MD SMOLA DOB:  1956-03-14  MRN:  [id number]   Primary Cardiologist: Dorris Carnes, MD  Chart reviewed as part of pre-operative protocol coverage. Patient was last seen by Dr. Harrington Challenger in 01/2020 at which time he was doing very well from a cardiac standpoint. Patient was contacted today for further pre-op evaluation and reported doing well since last visit. No chest pain, shortness of breath, orthopnea/PND, palpitations, syncope. He is able to complete >4.0 METS without any problems. Given past medical history and time since last visit, based on ACC/AHA guidelines, ALDAN CAMEY would be at acceptable risk for the planned procedure without further cardiovascular testing.   Patient has a history of STEMI in 06/2015 and was treated with 3 bare-metal stents to the LCX at that time. No problems since. No other cardiac interventions. No history of stroke. He has previously been given the OK to hold Plavix for 5-7 days before procedure. Given no new events or CVA since the time of last procedure, we can use this same recommendation. OK to hold Plavix for 5-7 days prior to upcoming lap chole. Please restart as soon as able postoperatively.  I will route this recommendation to the requesting party via Epic fax function and remove from pre-op pool.  Please call with questions.  Darreld Mclean, PA-C 09/06/2020, 12:47 PM

## 2020-09-23 ENCOUNTER — Ambulatory Visit: Payer: Self-pay | Admitting: General Surgery

## 2020-09-30 ENCOUNTER — Encounter (HOSPITAL_COMMUNITY): Admission: RE | Admit: 2020-09-30 | Payer: 59 | Source: Ambulatory Visit

## 2020-10-12 NOTE — Patient Instructions (Signed)
DUE TO COVID-19 ONLY ONE VISITOR IS ALLOWED TO COME WITH YOU AND STAY IN THE WAITING ROOM ONLY DURING PRE OP AND PROCEDURE DAY OF SURGERY. THE 1 VISITOR  MAY VISIT WITH YOU AFTER SURGERY IN YOUR PRIVATE ROOM DURING VISITING HOURS ONLY!                [name]     Your procedure is scheduled on: 10/18/20   Report to [facility] Main  Entrance   Report to short stay at 5:15 AM     Call this number if you have problems the morning of surgery [place], [place].   No solid food after midnight.   You may have clear liquids until 4:30 AM   Take these medicines the morning of surgery with A SIP OF WATER: Keppra, Metoprolol, Synthroid   How to Manage Your Diabetes Before and After Surgery  Why is it important to control my blood sugar before and after surgery? Improving blood sugar levels before and after surgery helps healing and can limit problems. A way of improving blood sugar control is eating a healthy diet by:  Eating less sugar and carbohydrates  Increasing activity/exercise  Talking with your doctor about reaching your blood sugar goals High blood sugars (greater than 180 mg/dL) can raise your risk of infections and slow your recovery, so you will need to focus on controlling your diabetes during the weeks before surgery. Make sure that the doctor who takes care of your diabetes knows about your planned surgery including the date and location.  How do I manage my blood sugar before surgery? Check your blood sugar at least 4 times a day, starting 2 days before surgery, to make sure that the level is not too high or low. Check your blood sugar the morning of your surgery when you wake up and every 2 hours until you get to the Short Stay unit. If your blood sugar is less than 70 mg/dL, you will need to treat for low blood sugar: Do not take insulin. Treat a low blood sugar (less  than 70 mg/dL) with  cup of clear juice (cranberry or apple), 4 glucose tablets, OR glucose gel. Recheck blood sugar in 15 minutes after treatment (to make sure it is greater than 70 mg/dL). If your blood sugar is not greater than 70 mg/dL on recheck, call [phone] for further instructions. Report your blood sugar to the short stay nurse when you get to Short Stay.  If you are admitted to the hospital after surgery: Your blood sugar will be checked by the staff and you will probably be given insulin after surgery (instead of oral diabetes medicines) to make sure you have good blood sugar levels. The goal for blood sugar control after surgery is 80-180 mg/dL.   WHAT DO I DO ABOUT MY DIABETES MEDICATION? The day before surgery take your usual dose of Soliqua.  Do not take oral diabetes medicines (pills) the morning of surgery.  THE NIGHT BEFORE SURGERY, take  0 units of insulin.       THE MORNING OF SURGERY, take  0 units of  insulin.  The day of surgery, do not take other diabetes injectables, including Byetta (exenatide), Bydureon (exenatide ER), Victoza (liraglutide), or Trulicity (dulaglutide). Marland Kitchen  You may not have any metal on your body including              piercings  Do not wear jewelry, lotions, powders or deodorant             Men may shave face and neck.   Do not bring valuables to the hospital. [place].  Contacts, dentures or bridgework may not be worn into surgery.       Patients discharged the day of surgery will not be allowed to drive home.   IF YOU ARE HAVING SURGERY AND GOING HOME THE SAME DAY, YOU MUST HAVE AN ADULT TO DRIVE YOU HOME AND BE WITH YOU FOR 24 HOURS.   YOU MAY GO HOME BY TAXI OR UBER OR ORTHERWISE, BUT AN ADULT MUST ACCOMPANY YOU HOME AND STAY WITH YOU FOR 24 HOURS.  Name and phone number of your driver:  Special Instructions: N/A              Please read over  the following fact sheets you were given: _____________________________________________________________________             [facility] - Preparing for Surgery Before surgery, you can play an important role.  Because skin is not sterile, your skin needs to be as free of germs as possible.  You can reduce the number of germs on your skin by washing with CHG (chlorahexidine gluconate) soap before surgery.  CHG is an antiseptic cleaner which kills germs and bonds with the skin to continue killing germs even after washing. Please DO NOT use if you have an allergy to CHG or antibacterial soaps.  If your skin becomes reddened/irritated stop using the CHG and inform your nurse when you arrive at Short Stay. .  You may shave your face/neck.  Please follow these instructions carefully:  1.  Shower with CHG Soap the night before surgery and the  morning of Surgery.  2.  If you choose to wash your hair, wash your hair first as usual with your  normal  shampoo.  3.  After you shampoo, rinse your hair and body thoroughly to remove the  shampoo.                                        4.  Use CHG as you would any other liquid soap.  You can apply chg directly  to the skin and wash                       Gently with a scrungie or clean washcloth.  5.  Apply the CHG Soap to your body ONLY FROM THE NECK DOWN.   Do not use on face/ open                           Wound or open sores. Avoid contact with eyes, ears mouth and genitals (private parts).                       Wash face,  Genitals (private parts) with your normal soap.             6.  Wash thoroughly, paying special attention to the area where your  surgery  will be performed.  7.  Thoroughly rinse your body with warm water from the neck down.  8.  DO NOT shower/wash with your normal soap after using and rinsing off  the CHG Soap.             9.  Pat yourself dry with a clean towel.            10.  Wear clean pajamas.            11.  Place clean sheets  on your bed the night of your first shower and do not  sleep with pets. Day of Surgery : Do not apply any lotions/deodorants the morning of surgery.  Please wear clean clothes to the hospital/surgery center.  FAILURE TO FOLLOW THESE INSTRUCTIONS MAY RESULT IN THE CANCELLATION OF YOUR SURGERY PATIENT SIGNATURE_________________________________  NURSE SIGNATURE__________________________________  ________________________________________________________________________

## 2020-10-13 ENCOUNTER — Encounter (HOSPITAL_COMMUNITY)
Admission: RE | Admit: 2020-10-13 | Discharge: 2020-10-13 | Disposition: A | Discharge status: Home / Self Care | Payer: Medicare Other | Source: Ambulatory Visit | Attending: General Surgery | Admitting: General Surgery

## 2020-10-13 ENCOUNTER — Encounter (HOSPITAL_COMMUNITY): Payer: Self-pay

## 2020-10-13 ENCOUNTER — Other Ambulatory Visit: Payer: Self-pay

## 2020-10-13 DIAGNOSIS — Z01818 Encounter for other preprocedural examination: Secondary | ICD-10-CM | POA: Insufficient documentation

## 2020-10-13 LAB — COMPREHENSIVE METABOLIC PANEL
ALT: 16 U/L (ref 0–44)
AST: 19 U/L (ref 15–41)
Albumin: 4.2 g/dL (ref 3.5–5.0)
Alkaline Phosphatase: 60 U/L (ref 38–126)
Anion gap: 8 (ref 5–15)
BUN: 20 mg/dL (ref 8–23)
CO2: 26 mmol/L (ref 22–32)
Calcium: 9.5 mg/dL (ref 8.9–10.3)
Chloride: 106 mmol/L (ref 98–111)
Creatinine, Ser: 1.42 mg/dL — ABNORMAL HIGH (ref 0.61–1.24)
GFR, Estimated: 55 mL/min — ABNORMAL LOW (ref >60)
Glucose, Bld: 143 mg/dL — ABNORMAL HIGH (ref 70–99)
Potassium: 4.7 mmol/L (ref 3.5–5.1)
Sodium: 140 mmol/L (ref 135–145)
Total Bilirubin: 0.6 mg/dL (ref 0.3–1.2)
Total Protein: 7.5 g/dL (ref 6.5–8.1)

## 2020-10-13 LAB — CBC WITH DIFFERENTIAL/PLATELET
Abs Immature Granulocytes: 0.1 10*3/uL — ABNORMAL HIGH (ref 0.00–0.07)
Basophils Absolute: 0.1 10*3/uL (ref 0.0–0.1)
Basophils Relative: 1 %
Eosinophils Absolute: 0.2 10*3/uL (ref 0.0–0.5)
Eosinophils Relative: 3 %
HCT: 42.2 % (ref 39.0–52.0)
Hemoglobin: 13.8 g/dL (ref 13.0–17.0)
Immature Granulocytes: 2 %
Lymphocytes Relative: 30 %
Lymphs Abs: 1.9 10*3/uL (ref 0.7–4.0)
MCH: 31.1 pg (ref 26.0–34.0)
MCHC: 32.7 g/dL (ref 30.0–36.0)
MCV: 95 fL (ref 80.0–100.0)
Monocytes Absolute: 0.3 10*3/uL (ref 0.1–1.0)
Monocytes Relative: 5 %
Neutro Abs: 3.7 10*3/uL (ref 1.7–7.7)
Neutrophils Relative %: 59 %
Platelets: 112 10*3/uL — ABNORMAL LOW (ref 150–400)
RBC: 4.44 MIL/uL (ref 4.22–5.81)
RDW: 12.5 % (ref 11.5–15.5)
WBC: 6.3 10*3/uL (ref 4.0–10.5)
nRBC: 0 % (ref 0.0–0.2)

## 2020-10-13 NOTE — Progress Notes (Signed)
COVID Vaccine Completed:Yes Date COVID Vaccine completed:06/30/19 Booster 03/11/20 COVID vaccine manufacturer: [place]      PCP - Dr. Deirdre Pippins.LOV requested 10/13/20 left message Cardiologist - Dr. Lizbeth Bark LOV. 01/19/20 clearance 09/06/20-epic  Chest x-ray - no EKG - 10/13/20-chart Stress Test - no ECHO - no Cardiac Cath - with 3 stents. Pt's card had the date of 06/22/2005 Pacemaker/ICD device last checked:NA  Sleep Study - yes CPAP - no  Fasting Blood Sugar - Lebre continuous on his Lt arm 78-190 Checks Blood Sugar _____ times a day  Blood Thinner Instructions:Plavix/ Dr/ P. Ross Aspirin Instructions: Stop 7 days prior to DOS/ Dr. Harrington Challenger . She okayed pt to stop on 10/09/20 Last Dose:10/09/20  Anesthesia review: Yes  Patient denies shortness of breath, fever, cough and chest pain at PAT appointment Pt has no SOB climbing stairs doing house or yard work or with ADLs.  Patient verbalized understanding of instructions that were given to them at the PAT appointment. Patient was also instructed that they will need to review over the PAT instructions again at home before surgery. yes

## 2020-10-14 LAB — HEMOGLOBIN A1C
Hgb A1c MFr Bld: 7.9 % — ABNORMAL HIGH (ref 4.8–5.6)
Mean Plasma Glucose: 180 mg/dL

## 2020-10-17 NOTE — H&P (Signed)
Carolan Clines Appointment: 09/03/2020 3:00 PM Location: [facility] Patient #: [id number] DOB: [birth year] Single / Language: Cleophus Molt / Race: White Male   History of Present Illness Randall Hiss M. Isiah Scheel MD; 09/03/2020 3:47 PM) The patient is a 65 year old male who presents for evaluation of gall stones. he is referred by dr Reynaldo Minium for gallbladder problems. he is accompanied by his daughter.  He reports some episodes of upper abdominal pain and points mainly to his right upper quadrant.  It was pretty intense and lasted for a little bit of time/ he will generally get nauseous with it.  He saw his primary care doctor who thought it was his gallbladder but also encouraged him to increase his Protonix to twice a day.  Labs were checked which were unremarkable metabolic panel and CBC.  He states that increasing Protonix actually made it worse.  He got an ultrasound which demonstrated cholelithiasis as well as some fatty infiltration.  He states that his episodes occur 3-4 times a week.  Generally in his right upper abdomen with some radiation to the back.  Generally associated with nausea and.  Often postprandial.  Prior robotic prostatectomy.  He is on Plavix.  Does not take prednisone.  Cardiologist is Dr. Harrington Challenger.  No chest pain, chest pressure, shortness of breath or dyspnea on exertion.  Takes Keppra for removed seizure disorder.  No recent seizures.  Is also a type II diabetic   Problem List/Past Medical Randall Hiss M. Redmond Pulling, MD; 09/03/2020 3:48 PM) DIABETES MELLITUS TYPE 2 IN OBESE (E11.69)   OBESITY (BMI 30-39.9) (E66.9)   SEIZURE DISORDER (G40.909)   SYMPTOMATIC CHOLELITHIASIS (K80.20)   PLATELET INHIBITION DUE TO PLAVIX (Z79.02)    Past Surgical History (Charmella Little, CNA; 09/03/2020 3:08 PM) Shoulder Surgery   Left. Vasectomy    Diagnostic Studies History (Charmella Little, CNA; 09/03/2020 3:08 PM) Colonoscopy   >10 years ago  Allergies (Charmella Little, CNA; 09/03/2020 3:08 PM) No Known  Drug Allergies   [09/03/2020]: Allergies Reconciled    Medication History (Charmella Little, CNA; 09/03/2020 3:10 PM) HYDROcodone-Acetaminophen  (5-'325MG'$  Tablet, Oral) Active. Testosterone Cypionate  ('200MG'$ /ML Solution, Intramuscular) Active. Promethazine-Codeine  (6.25-'10MG'$ /5ML Solution, Oral) Active. BD Pen Needle Mini U/F  (31G X 5 MM Misc,) Active. Clopidogrel Bisulfate  ('75MG'$  Tablet, Oral) Active. Flovent HFA  (110MCG/ACT Aerosol, Inhalation) Active. FreeStyle Libre 2 Sensor  Active. HumaLOG KwikPen  (100UNIT/ML Soln Pen-inj, Subcutaneous) Active. levETIRAcetam  ('750MG'$  Tablet, Oral) Active. Levothyroxine Sodium  (137MCG/ML Solution, Oral) Active. Levothyroxine Sodium  (125MCG Tablet, Oral) Active. metFORMIN HCl  ('500MG'$  Tablet, Oral) Active. Methocarbamol  ('500MG'$  Tablet, Oral) Active. Metoprolol Succinate ER  ('25MG'$  Tablet ER 24HR, Oral) Active. Pantoprazole Sodium  ('40MG'$  Tablet DR, Oral) Active. predniSONE  ('10MG'$  (21) Tab Ther Pack, Oral) Active. predniSONE  ('5MG'$  (21) Tab Ther Pack, Oral) Active. Rosuvastatin Calcium  ('10MG'$  Tablet, Oral) Active. Soliqua  (100-33UNT-MCG/ML Soln Pen-inj, Subcutaneous) Active. Synthroid  (137MCG Tablet, Oral) Active. traZODone HCl  ('50MG'$  Tablet, Oral) Active.  Social History (Charmella Little, CNA; 09/03/2020 3:08 PM) No alcohol use   No caffeine use   No drug use   Tobacco use   Never smoker.  Family History (Charmella Little, CNA; 09/03/2020 3:08 PM) Diabetes Mellitus   Father. Heart Disease   Father, Mother. Hypertension   Father. Kidney Disease   Father. Migraine Headache   Mother. Prostate Cancer   Father. Respiratory Condition   Father.  Other Problems Randall Hiss M. Redmond Pulling, MD; 09/03/2020 3:48 PM) Back Pain   Diabetes Mellitus  Gastroesophageal Reflux Disease   Kidney Stone   Myocardial infarction   Prostate Cancer   Seizure Disorder   Vascular Disease      Review of Systems Randall Hiss M. Vedant Shehadeh MD; 09/03/2020 3:45 PM) General Present-  Fatigue and Night Sweats. Not Present- Appetite Loss, Chills, Fever, Weight Gain and Weight Loss. Skin Not Present- Change in Wart/Mole, Dryness, Hives, Jaundice, New Lesions, Non-Healing Wounds, Rash and Ulcer. HEENT Present- Seasonal Allergies and Wears glasses/contact lenses. Not Present- Earache, Hearing Loss, Hoarseness, Nose Bleed, Oral Ulcers, Ringing in the Ears, Sinus Pain, Sore Throat, Visual Disturbances and Yellow Eyes. Respiratory Not Present- Bloody sputum, Chronic Cough, Difficulty Breathing, Snoring and Wheezing. Gastrointestinal Present- Nausea and Vomiting. Not Present- Abdominal Pain, Bloating, Bloody Stool, Change in Bowel Habits, Chronic diarrhea, Constipation, Difficulty Swallowing, Excessive gas, Gets full quickly at meals, Hemorrhoids, Indigestion and Rectal Pain. All other systems negative  Vitals (Charmella Little CNA; 09/03/2020 3:10 PM) 09/03/2020 3:10 PM Weight: 227 lb   Height: 71 in  Body Surface Area: 2.23 m   Body Mass Index: 31.66 kg/m   Temp.: 98 F    Pulse: 81 (Regular)    P.OX: 99% (Room air) BP: 122/80(Sitting, Left Arm, Standard)       Physical Exam Randall Hiss M. Graham Doukas MD; 09/03/2020 3:45 PM) General Mental Status - Alert. General Appearance - Consistent with stated age. Hydration - Well hydrated. Voice - Normal.  Head and Neck Head - normocephalic, atraumatic with no lesions or palpable masses. Trachea - midline. Thyroid Gland Characteristics - normal size and consistency.  Eye Eyeball - Bilateral - Normal. Sclera/Conjunctiva - Bilateral - No scleral icterus.  Chest and Lung Exam Chest and lung exam reveals  - quiet, even and easy respiratory effort with no use of accessory muscles and on auscultation, normal breath sounds, no adventitious sounds and normal vocal resonance. Inspection Chest Wall - Normal. Back - normal.  Breast - Did not examine.  Cardiovascular Cardiovascular examination reveals  - normal heart sounds, regular rate and  rhythm with no murmurs and normal pedal pulses bilaterally.  Abdomen Inspection  Inspection of the abdomen reveals: Note: small umbilical fascial defect. Skin - Scar - Note: well healed trocar scars. Palpation/Percussion Palpation and Percussion of the abdomen reveal - Soft, Non Tender, No Rebound tenderness, No Rigidity (guarding) and No hepatosplenomegaly. Auscultation Auscultation of the abdomen reveals - Bowel sounds normal.  Peripheral Vascular Upper Extremity Palpation - Pulses bilaterally normal.  Neurologic Neurologic evaluation reveals  - alert and oriented x 3 with no impairment of recent or remote memory. Mental Status - Normal.  Neuropsychiatric The patient's mood and affect are described as  - normal. Judgment and Insight - insight is appropriate concerning matters relevant to self.  Musculoskeletal Normal Exam - Left - Upper Extremity Strength Normal and Lower Extremity Strength Normal. Normal Exam - Right - Upper Extremity Strength Normal and Lower Extremity Strength Normal.  Lymphatic Head & Neck  General Head & Neck Lymphatics: Bilateral - Description - Normal. Axillary - Did not examine. Femoral & Inguinal - Did not examine.    Assessment & Plan Randall Hiss M. Naisha Wisdom MD; 09/03/2020 3:48 PM) SYMPTOMATIC CHOLELITHIASIS (K80.20) Impression: I believe the patient's symptoms are consistent with gallbladder disease.  We discussed gallbladder disease. The patient was given Neurosurgeon. We discussed non-operative and operative management. We discussed the signs & symptoms of acute cholecystitis  I discussed laparoscopic cholecystectomy with IOC in detail. The patient was given educational material as well as diagrams detailing the procedure.  We discussed the risks and benefits of a laparoscopic cholecystectomy including, but not limited to bleeding, infection, injury to surrounding structures such as the intestine or liver, bile leak, retained gallstones, need to  convert to an open procedure, prolonged diarrhea, blood clots such as DVT, common bile duct injury, anesthesia risks, and possible need for additional procedures. We discussed the typical post-operative recovery course. I explained that the likelihood of improvement of their symptoms is good.  The patient has elected to proceed with surgery pending cardiac clearance  This patient encounter took 40 minutes today to perform the following: take history, perform exam, review outside records, interpret imaging, counsel the patient on their diagnosis and document encounter, findings & plan in the EHR Current Plans Pt Education - Pamphlet Given - Laparoscopic Gallbladder Surgery: discussed with patient and provided information. You are being scheduled for surgery - Our schedulers will call you.   You should hear from our office's scheduling department within 5 working days about the location, date, and time of surgery.  We try to make accommodations for patient's preferences in scheduling surgery, but sometimes the OR schedule or the surgeon's schedule prevents [place] from making those accommodations.  If you have not heard from our office [phone]) in 5 working days, call the office and ask for your surgeon's nurse.  If you have other questions about your diagnosis, plan, or surgery, call the office and ask for your surgeon's nurse.  I recommended obtaining preoperative cardiac clearance.  I am concerned about the health of the patient and the ability to tolerate the operation.  Therefore, we will request clearance by cardiology to better assess operative risk & see if a reevaluation, further workup, etc is needed.  Also recommendations on how medications such as for anticoagulation and blood pressure should be managed/held/restarted after surgery. PLATELET INHIBITION DUE TO PLAVIX (Z79.02) Impression: We will need cardiology clearance as well as permission all Plavix perioperatively OBESITY (BMI  30-39.9) (E66.9) DIABETES MELLITUS TYPE 2 IN OBESE (E11.69) SEIZURE DISORDER (G40.909)  Leighton Ruff. Redmond Pulling, MD, FACS General, Bariatric, & Minimally Invasive Surgery [facility] Surgery, [place]

## 2020-10-18 ENCOUNTER — Encounter (HOSPITAL_COMMUNITY): Payer: Self-pay | Admitting: General Surgery

## 2020-10-18 ENCOUNTER — Ambulatory Visit (HOSPITAL_COMMUNITY): Payer: Medicare Other | Admitting: Certified Registered Nurse Anesthetist

## 2020-10-18 ENCOUNTER — Encounter (HOSPITAL_COMMUNITY)
Admission: RE | Disposition: A | Discharge status: Home / Self Care | Payer: Self-pay | Source: Home / Self Care | Attending: General Surgery

## 2020-10-18 ENCOUNTER — Ambulatory Visit (HOSPITAL_COMMUNITY)
Admission: RE | Admit: 2020-10-18 | Discharge: 2020-10-18 | Disposition: A | Discharge status: Home / Self Care | Payer: Medicare Other | Attending: General Surgery | Admitting: General Surgery

## 2020-10-18 DIAGNOSIS — E1169 Type 2 diabetes mellitus with other specified complication: Secondary | ICD-10-CM | POA: Diagnosis not present

## 2020-10-18 DIAGNOSIS — G40909 Epilepsy, unspecified, not intractable, without status epilepticus: Secondary | ICD-10-CM | POA: Diagnosis not present

## 2020-10-18 DIAGNOSIS — K8020 Calculus of gallbladder without cholecystitis without obstruction: Secondary | ICD-10-CM | POA: Diagnosis not present

## 2020-10-18 DIAGNOSIS — Z7902 Long term (current) use of antithrombotics/antiplatelets: Secondary | ICD-10-CM | POA: Insufficient documentation

## 2020-10-18 DIAGNOSIS — Z794 Long term (current) use of insulin: Secondary | ICD-10-CM | POA: Insufficient documentation

## 2020-10-18 DIAGNOSIS — Z79899 Other long term (current) drug therapy: Secondary | ICD-10-CM | POA: Diagnosis not present

## 2020-10-18 DIAGNOSIS — Z79890 Hormone replacement therapy (postmenopausal): Secondary | ICD-10-CM | POA: Diagnosis not present

## 2020-10-18 DIAGNOSIS — E669 Obesity, unspecified: Secondary | ICD-10-CM | POA: Insufficient documentation

## 2020-10-18 DIAGNOSIS — Z7984 Long term (current) use of oral hypoglycemic drugs: Secondary | ICD-10-CM | POA: Diagnosis not present

## 2020-10-18 DIAGNOSIS — Z7951 Long term (current) use of inhaled steroids: Secondary | ICD-10-CM | POA: Diagnosis not present

## 2020-10-18 DIAGNOSIS — Z6831 Body mass index (BMI) 31.0-31.9, adult: Secondary | ICD-10-CM | POA: Diagnosis not present

## 2020-10-18 HISTORY — PX: CHOLECYSTECTOMY: SHX55

## 2020-10-18 LAB — GLUCOSE, CAPILLARY
Glucose-Capillary: 173 mg/dL — ABNORMAL HIGH (ref 70–99)
Glucose-Capillary: 154 mg/dL — ABNORMAL HIGH (ref 70–99)

## 2020-10-18 SURGERY — LAPAROSCOPIC CHOLECYSTECTOMY
Anesthesia: General | Site: Abdomen

## 2020-10-18 MED ORDER — FENTANYL CITRATE (PF) 100 MCG/2ML IJ SOLN
INTRAMUSCULAR | Status: AC
  Filled 2020-10-18: qty 2

## 2020-10-18 MED ORDER — LABETALOL HCL 5 MG/ML IV SOLN
10.0000 mg | Freq: Once | INTRAVENOUS | Status: AC
  Administered 2020-10-18: 10 mg via INTRAVENOUS

## 2020-10-18 MED ORDER — ACETAMINOPHEN 10 MG/ML IV SOLN: 1000.0000 mg | Freq: Once | INTRAVENOUS | Status: DC | PRN

## 2020-10-18 MED ORDER — PHENYLEPHRINE 40 MCG/ML (10ML) SYRINGE FOR IV PUSH (FOR BLOOD PRESSURE SUPPORT)
PREFILLED_SYRINGE | INTRAVENOUS | Status: DC | PRN
  Administered 2020-10-18 (×2): 120 ug via INTRAVENOUS
  Administered 2020-10-18: 40 ug via INTRAVENOUS
  Administered 2020-10-18: 120 ug via INTRAVENOUS

## 2020-10-18 MED ORDER — OXYCODONE HCL 5 MG PO TABS: 5.0000 mg | ORAL_TABLET | Freq: Four times a day (QID) | ORAL | 0 refills | Status: DC | PRN

## 2020-10-18 MED ORDER — PROPOFOL 10 MG/ML IV BOLUS
INTRAVENOUS | Status: AC
  Filled 2020-10-18: qty 20

## 2020-10-18 MED ORDER — CHLORHEXIDINE GLUCONATE CLOTH 2 % EX PADS: 6.0000 | MEDICATED_PAD | Freq: Once | CUTANEOUS | Status: DC

## 2020-10-18 MED ORDER — FENTANYL CITRATE (PF) 100 MCG/2ML IJ SOLN
25.0000 ug | INTRAMUSCULAR | Status: DC | PRN
  Administered 2020-10-18 (×3): 50 ug via INTRAVENOUS

## 2020-10-18 MED ORDER — BUPIVACAINE-EPINEPHRINE (PF) 0.25% -1:200000 IJ SOLN
INTRAMUSCULAR | Status: AC
  Filled 2020-10-18: qty 30

## 2020-10-18 MED ORDER — ACETAMINOPHEN 500 MG PO TABS: 1000.0000 mg | ORAL_TABLET | Freq: Three times a day (TID) | ORAL | 0 refills | Status: AC

## 2020-10-18 MED ORDER — FENTANYL CITRATE (PF) 250 MCG/5ML IJ SOLN
INTRAMUSCULAR | Status: AC
  Filled 2020-10-18: qty 5

## 2020-10-18 MED ORDER — ROCURONIUM BROMIDE 10 MG/ML (PF) SYRINGE
PREFILLED_SYRINGE | INTRAVENOUS | Status: DC | PRN
  Administered 2020-10-18: 60 mg via INTRAVENOUS

## 2020-10-18 MED ORDER — OXYCODONE HCL 5 MG PO TABS
5.0000 mg | ORAL_TABLET | Freq: Once | ORAL | Status: DC | PRN
Start: 2020-10-18 — End: 2020-10-18

## 2020-10-18 MED ORDER — LABETALOL HCL 5 MG/ML IV SOLN
5.0000 mg | Freq: Once | INTRAVENOUS | Status: AC
  Administered 2020-10-18: 5 mg via INTRAVENOUS

## 2020-10-18 MED ORDER — MIDAZOLAM HCL 2 MG/2ML IJ SOLN
INTRAMUSCULAR | Status: AC
  Filled 2020-10-18: qty 2

## 2020-10-18 MED ORDER — SODIUM CHLORIDE 0.9 % IR SOLN
Status: DC | PRN
  Administered 2020-10-18 (×2): 1000 mL

## 2020-10-18 MED ORDER — BUPIVACAINE-EPINEPHRINE 0.25% -1:200000 IJ SOLN
INTRAMUSCULAR | Status: DC | PRN
  Administered 2020-10-18: 20 mL

## 2020-10-18 MED ORDER — CLOPIDOGREL BISULFATE 75 MG PO TABS: 75.0000 mg | ORAL_TABLET | Freq: Every day | ORAL | Status: AC

## 2020-10-18 MED ORDER — ONDANSETRON HCL 4 MG/2ML IJ SOLN
INTRAMUSCULAR | Status: DC | PRN
  Administered 2020-10-18: 4 mg via INTRAVENOUS

## 2020-10-18 MED ORDER — DEXAMETHASONE SODIUM PHOSPHATE 10 MG/ML IJ SOLN
INTRAMUSCULAR | Status: DC | PRN
  Administered 2020-10-18: 5 mg via INTRAVENOUS

## 2020-10-18 MED ORDER — ACETAMINOPHEN 160 MG/5ML PO SOLN: 1000.0000 mg | Freq: Once | ORAL | Status: DC | PRN

## 2020-10-18 MED ORDER — SODIUM CHLORIDE 0.9 % IV SOLN
2.0000 g | INTRAVENOUS | Status: AC
  Administered 2020-10-18: 2 g via INTRAVENOUS
  Filled 2020-10-18 (×2): qty 2

## 2020-10-18 MED ORDER — LABETALOL HCL 5 MG/ML IV SOLN
INTRAVENOUS | Status: AC
  Filled 2020-10-18: qty 4

## 2020-10-18 MED ORDER — FENTANYL CITRATE (PF) 100 MCG/2ML IJ SOLN
INTRAMUSCULAR | Status: DC | PRN
  Administered 2020-10-18 (×3): 50 ug via INTRAVENOUS
  Administered 2020-10-18: 100 ug via INTRAVENOUS

## 2020-10-18 MED ORDER — OXYCODONE HCL 5 MG/5ML PO SOLN: 5.0000 mg | Freq: Once | ORAL | Status: DC | PRN

## 2020-10-18 MED ORDER — ACETAMINOPHEN 500 MG PO TABS
1000.0000 mg | ORAL_TABLET | ORAL | Status: AC
  Administered 2020-10-18: 1000 mg via ORAL
  Filled 2020-10-18: qty 2

## 2020-10-18 MED ORDER — LIDOCAINE 2% (20 MG/ML) 5 ML SYRINGE
INTRAMUSCULAR | Status: DC | PRN
  Administered 2020-10-18: 60 mg via INTRAVENOUS

## 2020-10-18 MED ORDER — PROPOFOL 10 MG/ML IV BOLUS
INTRAVENOUS | Status: DC | PRN
  Administered 2020-10-18: 160 mg via INTRAVENOUS

## 2020-10-18 MED ORDER — ORAL CARE MOUTH RINSE: 15.0000 mL | Freq: Once | OROMUCOSAL | Status: AC

## 2020-10-18 MED ORDER — CHLORHEXIDINE GLUCONATE 0.12 % MT SOLN
15.0000 mL | Freq: Once | OROMUCOSAL | Status: AC
  Administered 2020-10-18: 15 mL via OROMUCOSAL

## 2020-10-18 MED ORDER — MIDAZOLAM HCL 5 MG/5ML IJ SOLN
INTRAMUSCULAR | Status: DC | PRN
  Administered 2020-10-18: 2 mg via INTRAVENOUS

## 2020-10-18 MED ORDER — ONDANSETRON HCL 4 MG/2ML IJ SOLN
INTRAMUSCULAR | Status: AC
  Filled 2020-10-18: qty 2

## 2020-10-18 MED ORDER — ACETAMINOPHEN 10 MG/ML IV SOLN
INTRAVENOUS | Status: AC
  Filled 2020-10-18: qty 100

## 2020-10-18 MED ORDER — SUGAMMADEX SODIUM 200 MG/2ML IV SOLN
INTRAVENOUS | Status: DC | PRN
  Administered 2020-10-18: 200 mg via INTRAVENOUS

## 2020-10-18 MED ORDER — LACTATED RINGERS IV SOLN: INTRAVENOUS | Status: DC

## 2020-10-18 MED ORDER — ACETAMINOPHEN 500 MG PO TABS: 1000.0000 mg | ORAL_TABLET | Freq: Once | ORAL | Status: DC | PRN

## 2020-10-18 SURGICAL SUPPLY — 61 items
ADH SKN CLS APL DERMABOND .7 (GAUZE/BANDAGES/DRESSINGS) ×1
APL PRP STRL LF DISP 70% ISPRP (MISCELLANEOUS) ×1
APL SKNCLS STERI-STRIP NONHPOA (GAUZE/BANDAGES/DRESSINGS)
APL SRG 38 LTWT LNG FL B (MISCELLANEOUS)
APPLICATOR ARISTA FLEXITIP XL (MISCELLANEOUS) IMPLANT
APPLIER CLIP 5 13 M/L LIGAMAX5 (MISCELLANEOUS)
APPLIER CLIP ROT 10 11.4 M/L (STAPLE) ×2
APR CLP MED LRG 11.4X10 (STAPLE) ×1
APR CLP MED LRG 5 ANG JAW (MISCELLANEOUS)
BAG COUNTER SPONGE SURGICOUNT (BAG) IMPLANT
BAG SPEC RTRVL 10 TROC 200 (ENDOMECHANICALS) ×1
BAG SPNG CNTER NS LX DISP (BAG)
BENZOIN TINCTURE PRP APPL 2/3 (GAUZE/BANDAGES/DRESSINGS) IMPLANT
BNDG ADH 1X3 SHEER STRL LF (GAUZE/BANDAGES/DRESSINGS) ×8 IMPLANT
BNDG ADH THN 3X1 STRL LF (GAUZE/BANDAGES/DRESSINGS) ×4
CABLE HIGH FREQUENCY MONO STRZ (ELECTRODE) ×2 IMPLANT
CHLORAPREP W/TINT 26 (MISCELLANEOUS) ×2 IMPLANT
CLIP APPLIE 5 13 M/L LIGAMAX5 (MISCELLANEOUS) IMPLANT
CLIP APPLIE ROT 10 11.4 M/L (STAPLE) IMPLANT
CLIP VESOLOCK MED LG 6/CT (CLIP) IMPLANT
COVER MAYO STAND STRL (DRAPES) IMPLANT
COVER SURGICAL LIGHT HANDLE (MISCELLANEOUS) ×2 IMPLANT
DECANTER SPIKE VIAL GLASS SM (MISCELLANEOUS) ×2 IMPLANT
DERMABOND ADVANCED (GAUZE/BANDAGES/DRESSINGS) ×1
DERMABOND ADVANCED .7 DNX12 (GAUZE/BANDAGES/DRESSINGS) IMPLANT
DRAPE C-ARM 42X120 X-RAY (DRAPES) IMPLANT
DRSG TEGADERM 2-3/8X2-3/4 SM (GAUZE/BANDAGES/DRESSINGS) ×4 IMPLANT
ELECT REM PT RETURN 15FT ADLT (MISCELLANEOUS) ×2 IMPLANT
ENDOLOOP SUT PDS II  0 18 (SUTURE) ×2
ENDOLOOP SUT PDS II 0 18 (SUTURE) IMPLANT
GAUZE SPONGE 2X2 8PLY STRL LF (GAUZE/BANDAGES/DRESSINGS) IMPLANT
GLOVE SRG 8 PF TXTR STRL LF DI (GLOVE) ×1 IMPLANT
GLOVE SURG MICRO LTX SZ7.5 (GLOVE) ×2 IMPLANT
GLOVE SURG UNDER POLY LF SZ8 (GLOVE) ×2
GOWN STRL REUS W/TWL XL LVL3 (GOWN DISPOSABLE) ×6 IMPLANT
GRASPER SUT TROCAR 14GX15 (MISCELLANEOUS) IMPLANT
HEMOSTAT ARISTA ABSORB 3G PWDR (HEMOSTASIS) IMPLANT
HEMOSTAT SNOW SURGICEL 2X4 (HEMOSTASIS) ×2 IMPLANT
KIT BASIN OR (CUSTOM PROCEDURE TRAY) ×2 IMPLANT
KIT TURNOVER KIT A (KITS) ×2 IMPLANT
L-HOOK LAP DISP 36CM (ELECTROSURGICAL)
LHOOK LAP DISP 36CM (ELECTROSURGICAL) IMPLANT
POUCH RETRIEVAL ECOSAC 10 (ENDOMECHANICALS) ×1 IMPLANT
POUCH RETRIEVAL ECOSAC 10MM (ENDOMECHANICALS) ×2
SCISSORS LAP 5X35 DISP (ENDOMECHANICALS) ×2 IMPLANT
SET CHOLANGIOGRAPH MIX (MISCELLANEOUS) IMPLANT
SET IRRIG TUBING LAPAROSCOPIC (IRRIGATION / IRRIGATOR) ×2 IMPLANT
SET TUBE SMOKE EVAC HIGH FLOW (TUBING) ×2 IMPLANT
SLEEVE XCEL OPT CAN 5 100 (ENDOMECHANICALS) ×4 IMPLANT
SPONGE GAUZE 2X2 STER 10/PKG (GAUZE/BANDAGES/DRESSINGS) ×1
STRIP CLOSURE SKIN 1/2X4 (GAUZE/BANDAGES/DRESSINGS) ×1 IMPLANT
SUT MNCRL AB 4-0 PS2 18 (SUTURE) ×2 IMPLANT
SUT VIC AB 0 UR5 27 (SUTURE) IMPLANT
SUT VICRYL 0 TIES 12 18 (SUTURE) IMPLANT
SUT VICRYL 0 UR6 27IN ABS (SUTURE) IMPLANT
TOWEL OR 17X26 10 PK STRL BLUE (TOWEL DISPOSABLE) ×2 IMPLANT
TOWEL OR NON WOVEN STRL DISP B (DISPOSABLE) ×2 IMPLANT
TRAY LAPAROSCOPIC (CUSTOM PROCEDURE TRAY) ×2 IMPLANT
TROCAR BLADELESS OPT 5 100 (ENDOMECHANICALS) ×2 IMPLANT
TROCAR XCEL BLUNT TIP 100MML (ENDOMECHANICALS) ×1 IMPLANT
TROCAR XCEL NON-BLD 11X100MML (ENDOMECHANICALS) IMPLANT

## 2020-10-18 NOTE — Transfer of Care (Signed)
Immediate Anesthesia Transfer of Care Note  Patient: [name]  Procedure(s) Performed: LAPAROSCOPIC CHOLECYSTECTOMY (Abdomen)  Patient Location: PACU  Anesthesia Type:General  Level of Consciousness: sedated, patient cooperative and responds to stimulation  Airway & Oxygen Therapy: Patient Spontanous Breathing and Patient connected to face mask oxygen  Post-op Assessment: Report given to RN and Post -op Vital signs reviewed and stable  Post vital signs: Reviewed and stable  Last Vitals:  Vitals Value Taken Time  BP 192/100 10/18/20 0926  Temp    Pulse 80 10/18/20 0927  Resp 12 10/18/20 0927  SpO2 100 % 10/18/20 0927  Vitals shown include unvalidated device data.  Last Pain:  Vitals:   10/18/20 0537  TempSrc: Oral  PainSc: 0-No pain      Patients Stated Pain Goal: 4 ([id number])  Complications: No notable events documented.

## 2020-10-18 NOTE — H&P (Signed)
CC: here for surgery  Requesting provider: dr Joya Salm  HPI: [name] is an 65 y.o. male who is here for lap chole with poss ioc. Denies any changes since seen in clinic in June except for covid aroung 7/4. No lingering symptoms. Stopped blood thinner 1 week ago Saturday.  The patient is a 65 year old male who presents for evaluation of gall stones. he is referred by dr Reynaldo Minium for gallbladder problems. he is accompanied by his daughter.  He reports some episodes of upper abdominal pain and points mainly to his right upper quadrant.  It was pretty intense and lasted for a little bit of time/ he will generally get nauseous with it.  He saw his primary care doctor who thought it was his gallbladder but also encouraged him to increase his Protonix to twice a day.  Labs were checked which were unremarkable metabolic panel and CBC.  He states that increasing Protonix actually made it worse.  He got an ultrasound which demonstrated cholelithiasis as well as some fatty infiltration.  He states that his episodes occur 3-4 times a week.  Generally in his right upper abdomen with some radiation to the back.  Generally associated with nausea and.  Often postprandial.  Prior robotic prostatectomy.  He is on Plavix.  Does not take prednisone.  Cardiologist is Dr. Harrington Challenger.  No chest pain, chest pressure, shortness of breath or dyspnea on exertion.  Takes Keppra for removed seizure disorder.  No recent seizures.  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   Right leg   Diabetic neuropathy ([place]) 01/08/2018   feet   History of kidney stones    Hyperlipidemia    Myocardial infarction [facility]) 2007   Other iatrogenic hypothyroidism    Seizure ([place])    last seizure 2000 due to vasculitis    Past Surgical History:  Procedure Laterality Date   BRAIN SURGERY   brain biopsy   2000 to figure out cause of headaches dx with vasculitis   CARDIAC CATHETERIZATION  06/22/2005   3 stents   PELVIC LYMPH NODE DISSECTION Bilateral 12/11/2018   Procedure: PELVIC LYMPH NODE DISSECTION;  Surgeon: Ardis Hughs, MD;  Location: [facility] ORS;  Service: Urology;  Laterality: Bilateral;   PROSTATE BIOPSY  11/1998   ROBOT ASSISTED LAPAROSCOPIC RADICAL PROSTATECTOMY N/A 12/11/2018   Procedure: XI ROBOTIC ASSISTED LAPAROSCOPIC RADICAL PROSTATECTOMY;  Surgeon: Ardis Hughs, MD;  Location: [facility] ORS;  Service: Urology;  Laterality: N/A;   rotater cuff surgery Left 01/17/2017    Family History  Problem Relation Age of Onset   Heart attack Mother        age 80   Heart attack Father        CABG    Social:  reports that he has never smoked. He has never used smokeless tobacco. He reports current alcohol use. He reports that he does not use drugs.  Allergies: No Known Allergies  Medications: I have reviewed the patient's current medications.   ROS - all of the below systems have been reviewed with the patient and positives are indicated with bold text General: chills, fever or night sweats Eyes: blurry vision or double vision ENT: epistaxis or sore throat Allergy/Immunology: itchy/watery eyes or nasal congestion Hematologic/Lymphatic: bleeding problems, blood clots or swollen  lymph nodes Endocrine: temperature intolerance or unexpected weight changes Breast: new or changing breast lumps or nipple discharge Resp: cough, shortness of breath, or wheezing CV: chest pain or dyspnea on exertion GI: as per HPI GU: dysuria, trouble voiding, or hematuria MSK: joint pain or joint stiffness Neuro: TIA or stroke symptoms Derm: pruritus and skin lesion changes Psych: anxiety and depression  PE Blood pressure 125/70, pulse 79, temperature 97.8 F (36.6 C), temperature source Oral, resp. rate 16, height '5\' 11"'$  (1.803 m), weight 102.1 kg, SpO2 97 %. Constitutional:  NAD; conversant; no deformities Eyes: Moist conjunctiva; no lid lag; anicteric; PERRL Neck: Trachea midline; no thyromegaly Lungs: Normal respiratory effort; no tactile fremitus CV: RRR; no palpable thrills; no pitting edema GI: Abd soft, nt; no palpable hepatosplenomegaly MSK: Normal gait; no clubbing/cyanosis Psychiatric: Appropriate affect; alert and oriented x3 Lymphatic: No palpable cervical or axillary lymphadenopathy Skin:no rash  Results for orders placed or performed during the hospital encounter of 10/18/20 (from the past 48 hour(s))  Glucose, capillary     Status: Abnormal   Collection Time: 10/18/20  5:47 AM  Result Value Ref Range   Glucose-Capillary 173 (H) 70 - 99 mg/dL    Comment: Glucose reference range applies only to samples taken after fasting for at least 8 hours.    No results found.  Imaging: N/a  A/P: [name] is an 65 y.o. male with symptomatic cholelithiasis To or forlap chole with poss ioc  All questions asked and answered  Leighton Ruff. Redmond Pulling, MD, FACS General, Bariatric, & Minimally Invasive Surgery [facility] Surgery, [place]

## 2020-10-18 NOTE — Anesthesia Procedure Notes (Signed)
Procedure Name: Intubation Date/Time: 10/18/2020 7:42 AM Performed by: Gean Maidens, CRNA Pre-anesthesia Checklist: Patient identified, Emergency Drugs available, Suction available, Patient being monitored and Timeout performed Patient Re-evaluated:Patient Re-evaluated prior to induction Oxygen Delivery Method: Circle system utilized Preoxygenation: Pre-oxygenation with 100% oxygen Induction Type: IV induction Ventilation: Mask ventilation without difficulty Laryngoscope Size: Mac and 4 Grade View: Grade II Tube type: Oral Tube size: 7.5 mm Number of attempts: 1 Airway Equipment and Method: Stylet Placement Confirmation: ETT inserted through vocal cords under direct vision, positive ETCO2 and breath sounds checked- equal and bilateral Secured at: 23 cm Tube secured with: Tape Dental Injury: Teeth and Oropharynx as per pre-operative assessment

## 2020-10-18 NOTE — Anesthesia Preprocedure Evaluation (Addendum)
Anesthesia Evaluation  Patient identified by MRN, date of birth, ID band Patient awake    Reviewed: Allergy & Precautions, NPO status , Patient's Chart, lab work & pertinent test results  History of Anesthesia Complications Negative for: history of anesthetic complications  Airway Mallampati: IV  TM Distance: >3 FB Neck ROM: Full    Dental  (+) Dental Advisory Given, Teeth Intact   Pulmonary neg pulmonary ROS,  Covid-19 Nucleic Acid Test Results Lab Results      Component                Value               Date                      SARSCOV2NAA              NOT DETECTED        12/07/2018              breath sounds clear to auscultation       Cardiovascular (-) angina+ CAD and + Past MI  (-) dysrhythmias  Rhythm:Regular  Per cardiology 01/2020:  He is status post ST elevation MI in April 2007 has 3 bare-metal stents to the circumflex. Repeat catheterization 2009 showed a 60-65% mid RCA stenosis; small RV branch with 80% stenosis. D2 had a 70% ostial lesion. D1 40%. The stents in the LCX had 50% instent proximally.LVEF 55% overall unchanged F/U in 1 year     Neuro/Psych Seizures -,  negative psych ROS   GI/Hepatic negative GI ROS, Neg liver ROS,   Endo/Other  diabetes, Type 2, Oral Hypoglycemic AgentsHypothyroidism   Renal/GU Renal InsufficiencyRenal diseaseLab Results      Component                Value               Date                      CREATININE               1.42 (H)            10/13/2020                Musculoskeletal   Abdominal   Peds  Hematology   Anesthesia Other Findings   Reproductive/Obstetrics                            Anesthesia Physical Anesthesia Plan  ASA: 2  Anesthesia Plan: General   Post-op Pain Management:    Induction: Intravenous  PONV Risk Score and Plan: 2 and Ondansetron and Dexamethasone  Airway Management Planned: Oral ETT  Additional Equipment:  None  Intra-op Plan:   Post-operative Plan: Extubation in OR  Informed Consent: I have reviewed the patients History and Physical, chart, labs and discussed the procedure including the risks, benefits and alternatives for the proposed anesthesia with the patient or authorized representative who has indicated his/her understanding and acceptance.     Dental advisory given  Plan Discussed with: CRNA and Surgeon  Anesthesia Plan Comments:         Anesthesia Quick Evaluation

## 2020-10-18 NOTE — Op Note (Signed)
NAIEM EVERIST [id number] March 14, 1956 10/18/2020  Laparoscopic Cholecystectomy  Procedure Note  Indications: This patient presents with symptomatic gallbladder disease and will undergo laparoscopic cholecystectomy.  Pre-operative Diagnosis: Calculus of gallbladder without cholecystitis and obstruction  Post-operative Diagnosis: Calculus of gallbladder with other cholecystitis, without mention of obstruction  Surgeon: Greer Pickerel MD FACS  Assistants: none  Anesthesia: General endotracheal anesthesia   Procedure Details  The patient was seen again in the Holding Room. The risks, benefits, complications, treatment options, and expected outcomes were discussed with the patient. The possibilities of reaction to medication, pulmonary aspiration, perforation of viscus, bleeding, recurrent infection, finding a normal gallbladder, the need for additional procedures, failure to diagnose a condition, the possible need to convert to an open procedure, and creating a complication requiring transfusion or operation were discussed with the patient. The likelihood of improving the patient's symptoms with return to their baseline status is good.  The patient and/or family concurred with the proposed plan, giving informed consent. The site of surgery properly noted. The patient was taken to Operating Room, identified as TRYTON HERTING and the procedure verified as Laparoscopic Cholecystectomy with Intraoperative Cholangiogram. A Time Out was held and the above information confirmed. Antibiotic prophylaxis was administered.   Prior to the induction of general anesthesia, antibiotic prophylaxis was administered. General endotracheal anesthesia was then administered and tolerated well. After the induction, the abdomen was prepped with Chloraprep and draped in the sterile fashion. The patient was positioned in the supine position.  Local anesthetic agent was injected into the skin near the umbilicus and an incision  made through an old suprapubic robotic trocar site.  We dissected down to the abdominal fascia with blunt dissection.  The fascia was incised vertically and we entered the peritoneal cavity bluntly.  A pursestring suture of 0-Vicryl was placed around the fascial opening.  The Hasson cannula was inserted and secured with the stay suture.  Pneumoperitoneum was then created with CO2 and tolerated well without any adverse changes in the patient's vital signs.  There was omentum adhered around the umbilicus and supraumbilical position but I was able to advance the camera through it and visualize the right upper quadrant.  An 5-mm port was placed in the subxiphoid position.  Two 5-mm ports were placed in the right upper quadrant. All skin incisions were infiltrated with a local anesthetic agent before making the incision and placing the trocars.   We positioned the patient in reverse Trendelenburg, tilted slightly to the patient's left.  The gallbladder was identified, the fundus grasped and retracted cephalad.  Patient had a large amount of omentum adhered to the right hepatic lobe as well as to the body of the gallbladder and the medial portion of the right hepatic lobe.  The liver capsule was very friable.  In trying to separate the omentum from the liver in order to retract the gallbladder toward the right shoulder the liver capsule tore in several places which was not an unexpected occurrence.  The omentum was very friable and oozy.  Adhesions were lysed bluntly and with the electrocautery where indicated, taking care not to injure any adjacent organs or viscus. The infundibulum was grasped and retracted laterally, exposing the peritoneum overlying the triangle of Calot.  There was a large amount of dense omentum adhered to the lower body and infundibulum of the gallbladder.  It took some time to mobilize the omentum off of the infundibulum and gallbladder.  It was not woody inflammation per se but just friable  inflammation.  There is spillage of bile from the upper portion of the gallbladder.  But there is no stone spillage. there was some bleeding from the omentum which was taking care of with cautery as well as with clips.  The peritoneum over the triangle of Calot was then divided and exposed in a blunt fashion. A critical view of the cystic duct and cystic artery was obtained.  The cystic duct was clearly identified and bluntly dissected circumferentially.  The cystic duct was then ligated with 3 clips and divided. The cystic artery which had been identified & dissected free was ligated with clips and divided as well.   The gallbladder was dissected from the liver bed in retrograde fashion with the electrocautery. The gallbladder was removed and placed in an Ecco sac.  The gallbladder and Ecco sac were then removed through the umbilical port site. The liver bed was irrigated and inspected. Hemostasis was achieved with the electrocautery. Copious irrigation was utilized and was repeatedly aspirated until clear.  I placed 2 pieces of surgical snow 1 in the gallbladder fossa and one along the right hepatic lobe where the capsule had been torn.  I freed the omentum from the umbilical area from around the [facility] trocar with hook electrocautery.  There is no bowel within this area.  The pursestring suture was used to close the umbilical fascia.  Additional interrupted 0 Vicryl was placed at the umbilical fascia using the PMI suture passer with laparoscopic guidance.  We again inspected the right upper quadrant for hemostasis.  The umbilical closure was inspected and there was no air leak and nothing trapped within the closure. Pneumoperitoneum was released as we removed the trocars.  4-0 Monocryl was used to close the skin.    steri-strips, and clean dressings were applied. The patient was then extubated and brought to the recovery room in stable condition. Instrument, sponge, and needle counts were correct at closure  and at the conclusion of the case.   Findings: Cholecystitis  with chronic  Cholelithiasis +critical view + 2 pieces of snow  Estimated Blood Loss: less than 50 mL         Drains: none         Specimens: Gallbladder           Complications: None; patient tolerated the procedure well.         Disposition: PACU - hemodynamically stable.         Condition: stable  Leighton Ruff. Redmond Pulling, MD, FACS General, Bariatric, & Minimally Invasive Surgery [facility], [place]

## 2020-10-18 NOTE — Discharge Instructions (Signed)
RESUME PLAVIX ON Thursday AUGUST 4  [facility] LAPAROSCOPIC SURGERY: POST OP INSTRUCTIONS Always review your discharge instruction sheet given to you by the facility where your surgery was performed. IF YOU HAVE DISABILITY OR FAMILY LEAVE FORMS, YOU MUST BRING THEM TO THE OFFICE FOR PROCESSING.   DO NOT GIVE THEM TO YOUR DOCTOR.  PAIN CONTROL  First take acetaminophen (Tylenol) AND/or ibuprofen (Advil) to control your pain after surgery.  Follow directions on package.  Taking acetaminophen (Tylenol) and/or ibuprofen (Advil) regularly after surgery will help to control your pain and lower the amount of prescription pain medication you may need.  You should not take more than 3,000 mg (3 grams) of acetaminophen (Tylenol) in 24 hours.  You should not take ibuprofen (Advil), aleve, motrin, naprosyn or other NSAIDS if you have a history of stomach ulcers or chronic kidney disease.  A prescription for pain medication may be given to you upon discharge.  Take your pain medication as prescribed, if you still have uncontrolled pain after taking acetaminophen (Tylenol) or ibuprofen (Advil). Use ice packs to help control pain. If you need a refill on your pain medication, please contact your pharmacy.  They will contact our office to request authorization. Prescriptions will not be filled after 5pm or on week-ends.  HOME MEDICATIONS Take your usually prescribed medications unless otherwise directed.  DIET You should follow a light diet the first few days after arrival home.  Be sure to include lots of fluids daily. Avoid fatty, fried foods.   CONSTIPATION It is common to experience some constipation after surgery and if you are taking pain medication.  Increasing fluid intake and taking a stool softener (such as Colace) will usually help or prevent this problem from occurring.  A mild laxative (Milk of Magnesia or Miralax) should be taken according to package instructions if there  are no bowel movements after 48 hours.  WOUND/INCISION CARE Most patients will experience some swelling and bruising in the area of the incisions.  Ice packs will help.  Swelling and bruising can take several days to resolve.  Unless discharge instructions indicate otherwise, follow guidelines below  STERI-STRIPS - you may remove your outer bandages 48 hours after surgery, and you may shower at that time.  You have steri-strips (small skin tapes) in place directly over the incision.  These strips should be left on the skin for 7-10 days.   DERMABOND/SKIN GLUE - you may shower in 24 hours.  The glue will flake off over the next 2-3 weeks. Any sutures or staples will be removed at the office during your follow-up visit.  ACTIVITIES You may resume regular (light) daily activities beginning the next day--such as daily self-care, walking, climbing stairs--gradually increasing activities as tolerated.  You may have sexual intercourse when it is comfortable.  Refrain from any heavy lifting or straining until approved by your doctor. You may drive when you are no longer taking prescription pain medication, you can comfortably wear a seatbelt, and you can safely maneuver your car and apply brakes.  FOLLOW-UP You should see your doctor in the office for a follow-up appointment approximately 2-3 weeks after your surgery.  You should have been given your post-op/follow-up appointment when your surgery was scheduled.  If you did not receive a post-op/follow-up appointment, make sure that you call for this appointment within a day or two after you arrive home to insure a convenient appointment time.  OTHER INSTRUCTIONS   WHEN TO CALL YOUR DOCTOR:  Fever over 101.0 Inability to urinate Continued bleeding from incision. Increased pain, redness, or drainage from the incision. Increasing abdominal pain  The clinic staff is available to answer your questions during regular business hours.  Please don't  hesitate to call and ask to speak to one of the nurses for clinical concerns.  If you have a medical emergency, go to the nearest emergency room or call 911.  A surgeon from [facility] is always on call at the hospital. [address], [place], [place], [place]  [address] ? [address], [place], [place]   [address] [phone] ? [phone] ? [phone] Web site: [email or web address]]

## 2020-10-19 LAB — SURGICAL PATHOLOGY

## 2020-10-19 NOTE — Anesthesia Postprocedure Evaluation (Signed)
Anesthesia Post Note  Patient: [name]  Procedure(s) Performed: LAPAROSCOPIC CHOLECYSTECTOMY (Abdomen)     Patient location during evaluation: PACU Anesthesia Type: General Level of consciousness: awake and alert Pain management: pain level controlled Vital Signs Assessment: post-procedure vital signs reviewed and stable Respiratory status: spontaneous breathing, nonlabored ventilation, respiratory function stable and patient connected to nasal cannula oxygen Cardiovascular status: blood pressure returned to baseline and stable Postop Assessment: no apparent nausea or vomiting Anesthetic complications: no   No notable events documented.  Last Vitals:  Vitals:   10/18/20 1100 10/18/20 1115  BP: (!) 172/84 (!) 163/95  Pulse: 74 79  Resp: 11 10  Temp:  36.5 C  SpO2: 99% 95%    Last Pain:  Vitals:   10/18/20 1115  TempSrc:   PainSc: 4                  Aadon Gorelik

## 2020-10-20 ENCOUNTER — Encounter (HOSPITAL_COMMUNITY): Payer: Self-pay | Admitting: General Surgery

## 2020-11-24 ENCOUNTER — Ambulatory Visit
Admission: RE | Admit: 2020-11-24 | Discharge: 2020-11-24 | Disposition: A | Discharge status: Home / Self Care | Payer: Medicare Other | Source: Ambulatory Visit | Attending: Internal Medicine | Admitting: Internal Medicine

## 2020-11-24 ENCOUNTER — Other Ambulatory Visit: Payer: Self-pay | Admitting: Internal Medicine

## 2020-11-24 ENCOUNTER — Other Ambulatory Visit: Payer: Self-pay

## 2020-11-24 DIAGNOSIS — R0602 Shortness of breath: Secondary | ICD-10-CM

## 2020-11-24 IMAGING — CT CT ANGIO CHEST
4 of 8 series · 18 of 36 positions shown · IV contrast (iopamidol)
Comparison: [DATE]

CLINICAL DATA: Shortness of breath, recent COVID, rule out PE

EXAM:
CT ANGIOGRAPHY CHEST WITH CONTRAST
TECHNIQUE: Multidetector CT imaging of the chest was performed using the
standard protocol during bolus administration of intravenous
contrast. Multiplanar CT image reconstructions and MIPs were
obtained to evaluate the vascular anatomy.
CONTRAST:  75mL [L2] IOPAMIDOL ([L2]) INJECTION 76%

[Series 5: cta pulmonary 2.00 bv36 s3 · axial · 0.66mm/px · 1 of 159 slices shown (1 of 2)]
[im 40/159  lung]
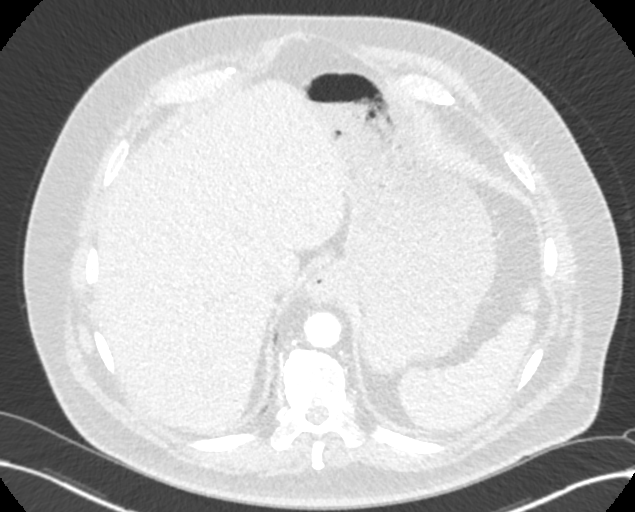

[Series 7: cta pulmonary 2.00 bv36 s3 · coronal · 0.62mm/px · 1 of 168 slices shown (2 of 2)]
[im 84/168  mediastinal]
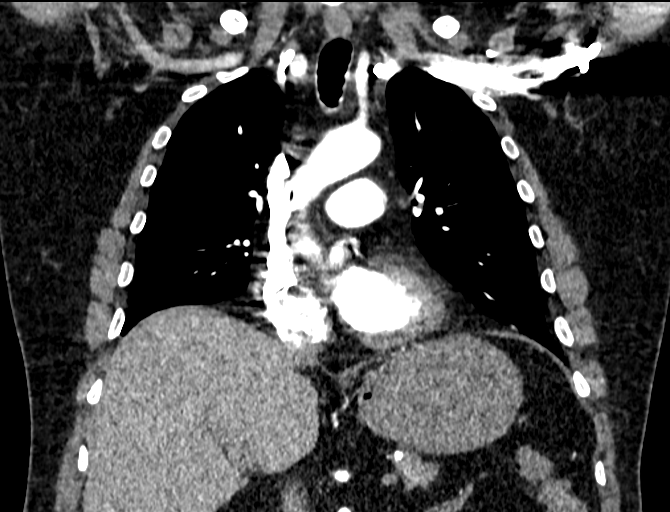

[Series 11: cta pulmonary 1.00 bv36 s3 thins · axial · 0.81mm/px · z∈[+1443,+1679]mm · 7 of 316 slices shown]
[im 40/316  lung]
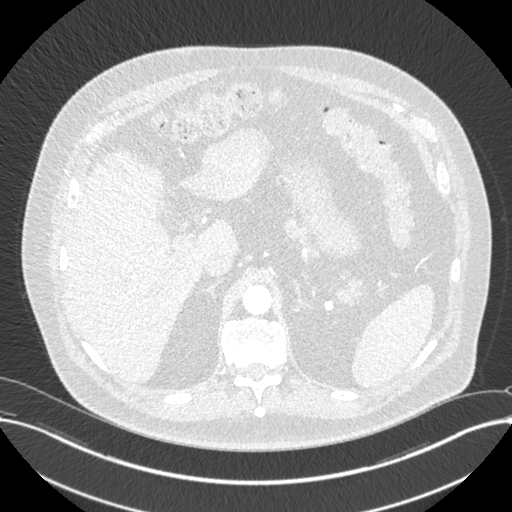
[im 79/316  lung]
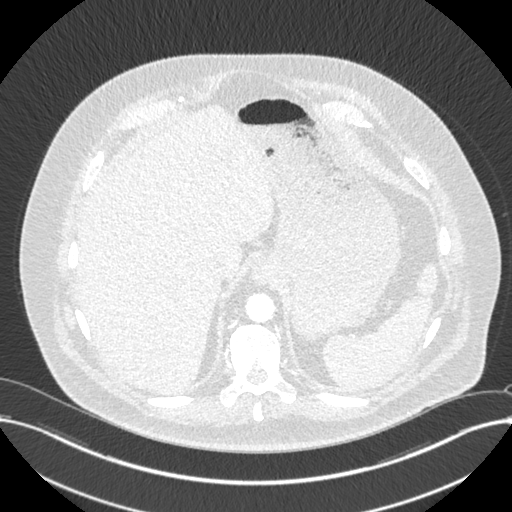
[im 119/316  lung]
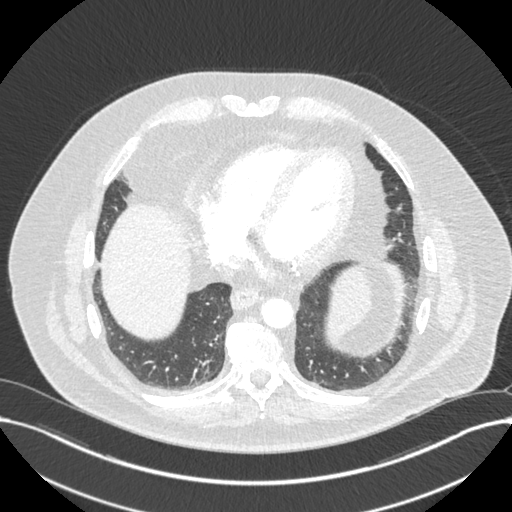
[im 158/316  lung]
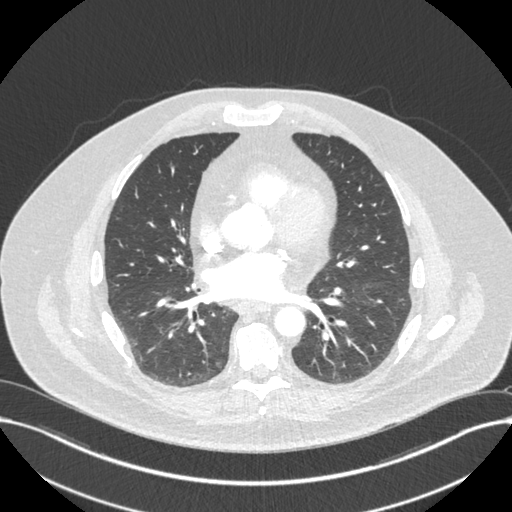
[im 197/316  lung]
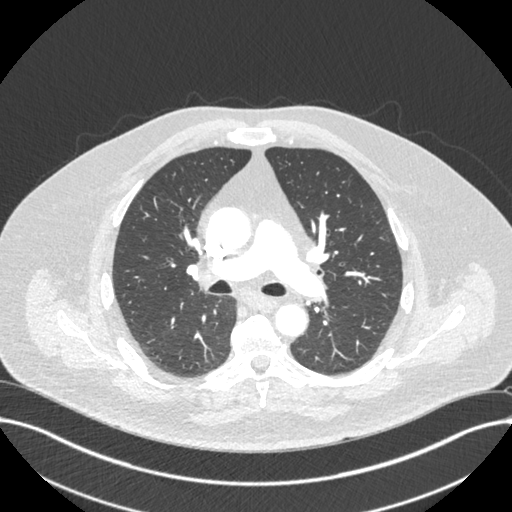
[im 237/316  lung]
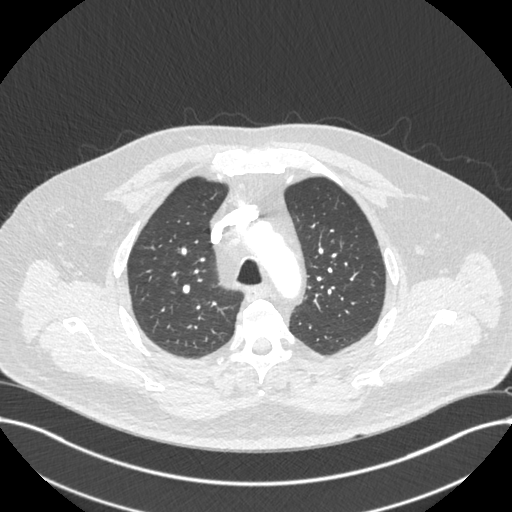
[im 276/316  lung]
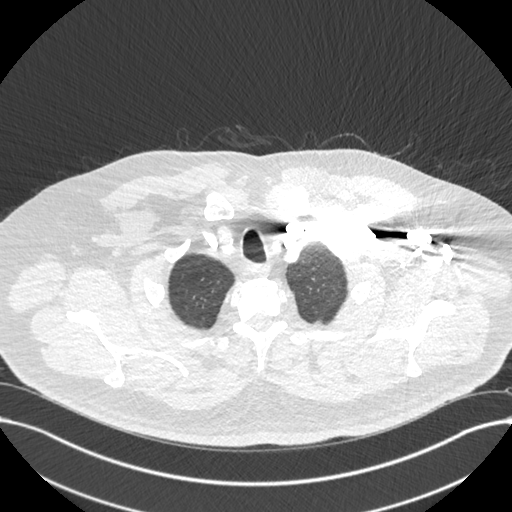

[Series 12: cta pulmonary 1.00 bv36 s3 super d. · axial · 0.81mm/px · z∈[+1435,+1687]mm · 9 of 395 slices shown]
[im 40/395  lung]
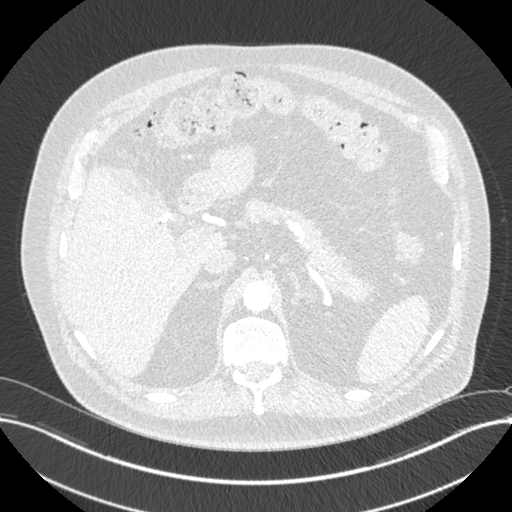
[im 79/395  mediastinal]
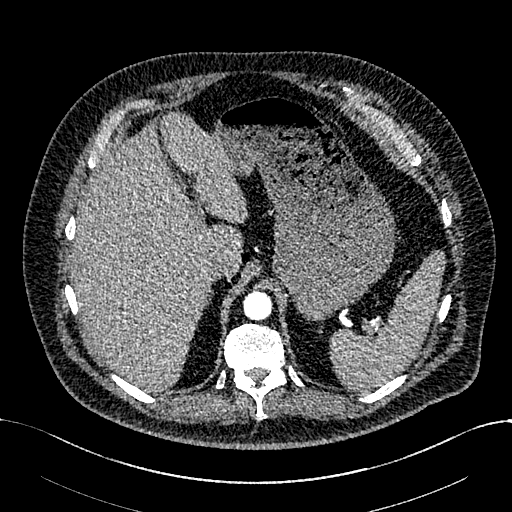
[im 119/395  lung]
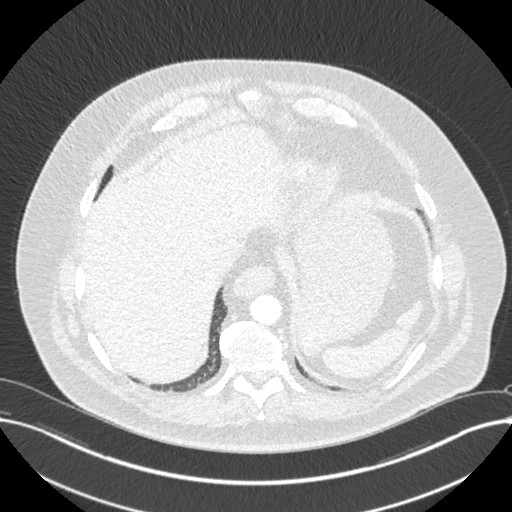
[im 158/395  mediastinal]
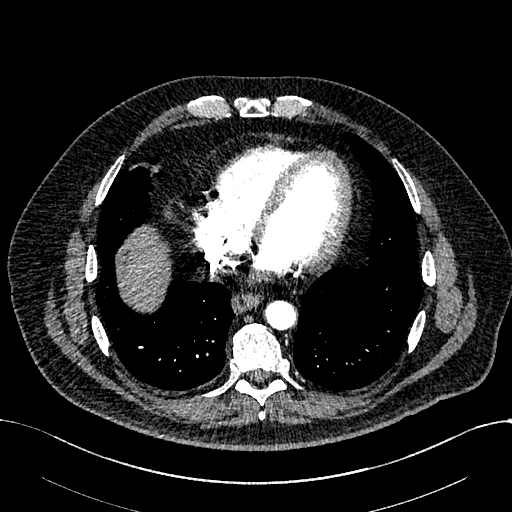
[im 198/395  lung]
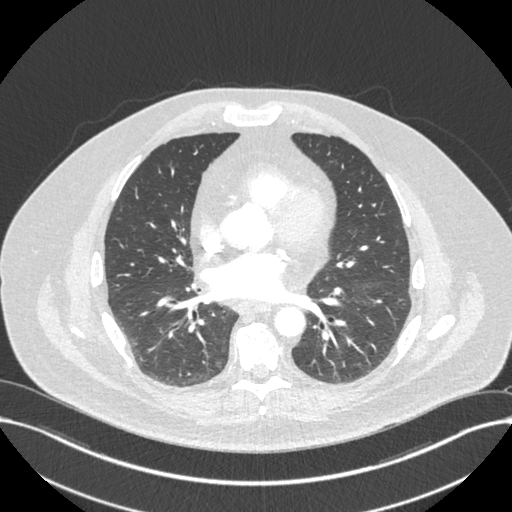
[im 237/395  mediastinal]
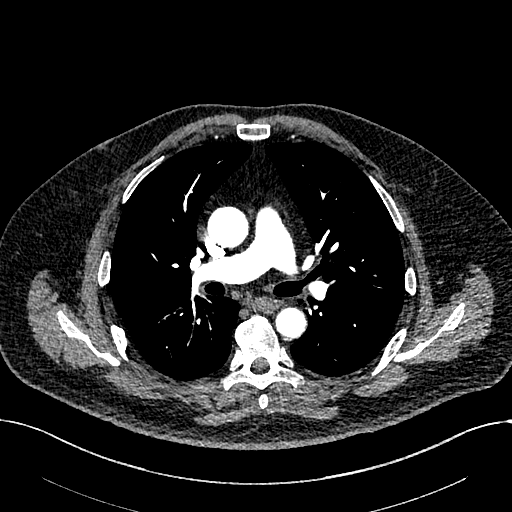
[im 276/395  lung]
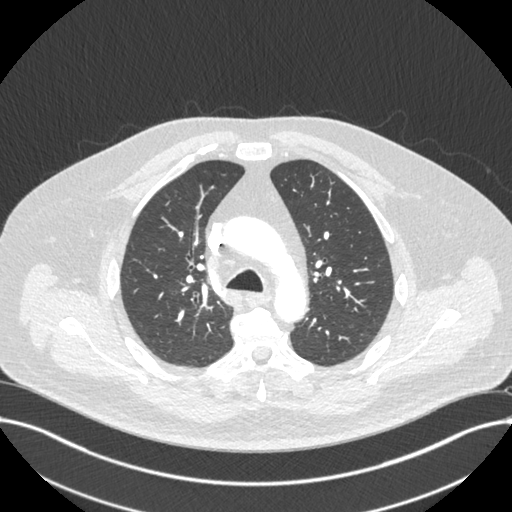
[im 316/395  mediastinal]
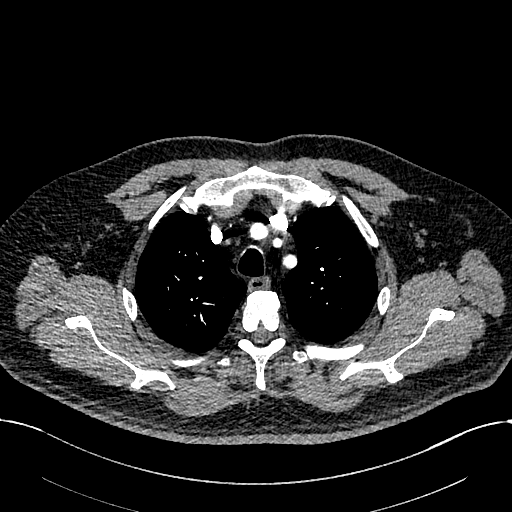
[im 355/395  lung]
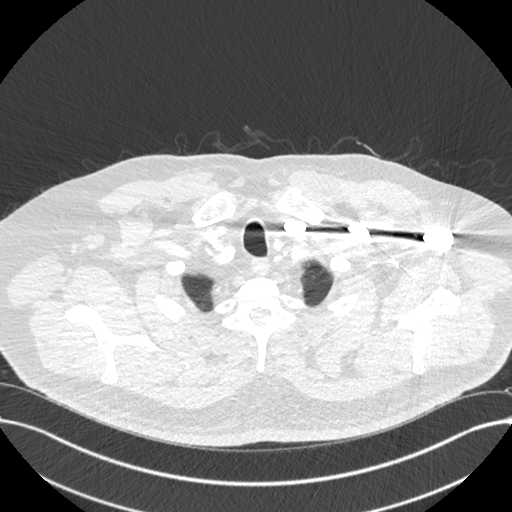

[18 of 36 positions shown; findings below may reference images not displayed]

FINDINGS: Cardiovascular: Satisfactory opacification of the pulmonary arteries
to the segmental level. No evidence of pulmonary embolism. Normal
heart size. Three-vessel coronary artery calcifications and/or
stents. No pericardial effusion. Aortic atherosclerosis.

Mediastinum/Nodes: No enlarged mediastinal, hilar, or axillary lymph
nodes. Thyroid gland, trachea, and esophagus demonstrate no
significant findings.

Lungs/Pleura: Mild, diffuse bilateral bronchial wall thickening. No
pleural effusion or pneumothorax.

Upper Abdomen: No acute abnormality. Status post interval
cholecystectomy

Musculoskeletal: No chest wall abnormality. No acute or significant
osseous findings.

Review of the MIP images confirms the above findings.
IMPRESSION: 1. Negative examination for pulmonary embolism.
2. Mild, diffuse bilateral bronchial wall thickening, consistent
with nonspecific infectious or inflammatory bronchitis.
3. Coronary artery disease.

Aortic Atherosclerosis ([L2]-[L2]).

## 2020-11-24 MED ORDER — IOPAMIDOL (ISOVUE-370) INJECTION 76%
75.0000 mL | Freq: Once | INTRAVENOUS | Status: AC | PRN
  Administered 2020-11-24: 75 mL via INTRAVENOUS

## 2020-11-28 ENCOUNTER — Emergency Department (HOSPITAL_BASED_OUTPATIENT_CLINIC_OR_DEPARTMENT_OTHER): Payer: Medicare Other

## 2020-11-28 ENCOUNTER — Other Ambulatory Visit: Payer: Self-pay

## 2020-11-28 ENCOUNTER — Emergency Department (HOSPITAL_BASED_OUTPATIENT_CLINIC_OR_DEPARTMENT_OTHER)
Admission: EM | Admit: 2020-11-28 | Discharge: 2020-11-28 | Disposition: A | Discharge status: Home / Self Care | Payer: Medicare Other | Attending: Emergency Medicine | Admitting: Emergency Medicine

## 2020-11-28 ENCOUNTER — Encounter (HOSPITAL_BASED_OUTPATIENT_CLINIC_OR_DEPARTMENT_OTHER): Payer: Self-pay | Admitting: Emergency Medicine

## 2020-11-28 DIAGNOSIS — E1169 Type 2 diabetes mellitus with other specified complication: Secondary | ICD-10-CM | POA: Diagnosis not present

## 2020-11-28 DIAGNOSIS — K429 Umbilical hernia without obstruction or gangrene: Secondary | ICD-10-CM

## 2020-11-28 DIAGNOSIS — I2510 Atherosclerotic heart disease of native coronary artery without angina pectoris: Secondary | ICD-10-CM | POA: Insufficient documentation

## 2020-11-28 DIAGNOSIS — E785 Hyperlipidemia, unspecified: Secondary | ICD-10-CM | POA: Diagnosis not present

## 2020-11-28 DIAGNOSIS — Z7984 Long term (current) use of oral hypoglycemic drugs: Secondary | ICD-10-CM | POA: Insufficient documentation

## 2020-11-28 DIAGNOSIS — Z20822 Contact with and (suspected) exposure to covid-19: Secondary | ICD-10-CM | POA: Diagnosis not present

## 2020-11-28 DIAGNOSIS — R1013 Epigastric pain: Secondary | ICD-10-CM

## 2020-11-28 DIAGNOSIS — Z794 Long term (current) use of insulin: Secondary | ICD-10-CM | POA: Diagnosis not present

## 2020-11-28 DIAGNOSIS — Z8546 Personal history of malignant neoplasm of prostate: Secondary | ICD-10-CM | POA: Insufficient documentation

## 2020-11-28 DIAGNOSIS — R0602 Shortness of breath: Secondary | ICD-10-CM | POA: Diagnosis present

## 2020-11-28 DIAGNOSIS — Z7902 Long term (current) use of antithrombotics/antiplatelets: Secondary | ICD-10-CM | POA: Diagnosis not present

## 2020-11-28 LAB — CBC WITH DIFFERENTIAL/PLATELET
Abs Immature Granulocytes: 0.16 10*3/uL — ABNORMAL HIGH (ref 0.00–0.07)
Basophils Absolute: 0.1 10*3/uL (ref 0.0–0.1)
Basophils Relative: 2 %
Eosinophils Absolute: 0.9 10*3/uL — ABNORMAL HIGH (ref 0.0–0.5)
Eosinophils Relative: 13 %
HCT: 40.2 % (ref 39.0–52.0)
Hemoglobin: 13.8 g/dL (ref 13.0–17.0)
Immature Granulocytes: 2 %
Lymphocytes Relative: 25 %
Lymphs Abs: 1.7 10*3/uL (ref 0.7–4.0)
MCH: 32.4 pg (ref 26.0–34.0)
MCHC: 34.3 g/dL (ref 30.0–36.0)
MCV: 94.4 fL (ref 80.0–100.0)
Monocytes Absolute: 0.4 10*3/uL (ref 0.1–1.0)
Monocytes Relative: 6 %
Neutro Abs: 3.6 10*3/uL (ref 1.7–7.7)
Neutrophils Relative %: 52 %
Platelets: 101 10*3/uL — ABNORMAL LOW (ref 150–400)
RBC: 4.26 MIL/uL (ref 4.22–5.81)
RDW: 15.9 % — ABNORMAL HIGH (ref 11.5–15.5)
WBC: 6.8 10*3/uL (ref 4.0–10.5)
nRBC: 0 % (ref 0.0–0.2)

## 2020-11-28 LAB — COMPREHENSIVE METABOLIC PANEL
ALT: 15 U/L (ref 0–44)
AST: 21 U/L (ref 15–41)
Albumin: 4.4 g/dL (ref 3.5–5.0)
Alkaline Phosphatase: 63 U/L (ref 38–126)
Anion gap: 10 (ref 5–15)
BUN: 17 mg/dL (ref 8–23)
CO2: 24 mmol/L (ref 22–32)
Calcium: 8.8 mg/dL — ABNORMAL LOW (ref 8.9–10.3)
Chloride: 97 mmol/L — ABNORMAL LOW (ref 98–111)
Creatinine, Ser: 1.54 mg/dL — ABNORMAL HIGH (ref 0.61–1.24)
GFR, Estimated: 50 mL/min — ABNORMAL LOW (ref >60)
Glucose, Bld: 140 mg/dL — ABNORMAL HIGH (ref 70–99)
Potassium: 4.2 mmol/L (ref 3.5–5.1)
Sodium: 131 mmol/L — ABNORMAL LOW (ref 135–145)
Total Bilirubin: 0.7 mg/dL (ref 0.3–1.2)
Total Protein: 8.1 g/dL (ref 6.5–8.1)

## 2020-11-28 LAB — BRAIN NATRIURETIC PEPTIDE: B Natriuretic Peptide: 31.1 pg/mL (ref 0.0–100.0)

## 2020-11-28 LAB — LIPASE, BLOOD: Lipase: 22 U/L (ref 11–51)

## 2020-11-28 LAB — RESP PANEL BY RT-PCR (FLU A&B, COVID) ARPGX2
Influenza A by PCR: NEGATIVE
Influenza B by PCR: NEGATIVE
SARS Coronavirus 2 by RT PCR: NEGATIVE

## 2020-11-28 LAB — TROPONIN I (HIGH SENSITIVITY)
Troponin I (High Sensitivity): 6 ng/L (ref <18)
Troponin I (High Sensitivity): 6 ng/L (ref <18)

## 2020-11-28 MED ORDER — IOHEXOL 350 MG/ML SOLN
100.0000 mL | Freq: Once | INTRAVENOUS | Status: AC | PRN
  Administered 2020-11-28: 100 mL via INTRAVENOUS

## 2020-11-28 NOTE — ED Provider Notes (Signed)
[facility]ERGENCY DEPARTMENT Provider Note   CSN: [id number] Arrival date & time: 11/28/20  1507     History Chief Complaint  Patient presents with   Shortness of Breath    [name] is a 65 y.o. male.  HPI     65 year old male with a history of prostate cancer, coronary artery disease, diabetes, hyperlipidemia, seizure, laparoscopic cholecystectomy August 11, presents with concern for shortness of breath.  Started last week, saw PCP and had EKG and CXR, told to try to expedite cardiology follow up which is currently scheduled for December and come to ED for evaluation.   Has had shortness of breath, waking up in middle of night short of breath, but no orthopnea, no leg swelling.  No cough or fever. No chest pain.   Over the last 2 weeks has also had epigastric abdominal pain. Aching pain 5/10 not worse with eating, nothing makes it better or worse. No nausea, vomiting, diarrhea, constipation.  Coming and going.   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   Diabetic neuropathy ([place]) 01/08/2018   Lumbar radiculopathy, right 04/18/2017   OBESITY, UNSPECIFIED 01/07/2009   OTHER SPECIFIED DISEASES OF PERICARDIUM 01/07/2009   HYPOTHYROIDISM-IATROGENIC 02/26/2008   HYPERLIPIDEMIA-MIXED 02/26/2008   CAD, NATIVE VESSEL 02/26/2008    Past Surgical History:  Procedure Laterality Date   BRAIN SURGERY  brain biopsy   2000 to figure out cause of headaches dx with vasculitis    CARDIAC CATHETERIZATION  06/22/2005   3 stents   CHOLECYSTECTOMY N/A 10/18/2020   Procedure: LAPAROSCOPIC CHOLECYSTECTOMY;  Surgeon: Greer Pickerel, MD;  Location: [facility] ORS;  Service: General;  Laterality: N/A;   PELVIC LYMPH NODE DISSECTION Bilateral 12/11/2018   Procedure: PELVIC LYMPH NODE DISSECTION;  Surgeon: Ardis Hughs, MD;  Location: [facility] OR[facility];  Service: Urology;  Laterality: Bilateral;   PROSTATE BIOPSY  11/1998   ROBOT ASSISTED LAPAROSCOPIC RADICAL PROSTATECTOMY N/A 12/11/2018   Procedure: XI ROBOTIC ASSISTED LAPAROSCOPIC RADICAL PROSTATECTOMY;  Surgeon: Ardis Hughs, MD;  Location: [facility];  Service: Urology;  Laterality: N/A;   rotater cuff surgery Left 01/17/2017       Family History  Problem Relation Age of Onset   Heart attack Mother        age 25   Heart attack Father        CABG    Social History   Tobacco Use   Smoking status: Never   Smokeless tobacco: Never  Vaping Use   Vaping Use: Never used  Substance Use Topics   Alcohol use: Yes    Comment: 1 2 times month beer   Drug use: No    Home Medications Prior to Admission medications   Medication Sig Start Date End Date Taking? Authorizing Provider  Aspirin-Caffeine (BAYER BACK & BODY) 500-32.5 MG TABS Take 1-2 tablets by mouth every 6 (six) hours as needed (pain).  [provider]  clopidogrel (PLAVIX) 75 MG tablet Take 1 tablet (75 mg total) by mouth daily. 10/21/20   Greer Pickerel, MD  CRESTOR 10 MG tablet take 1 tablet by mouth at bedtime Patient taking differently: Take 10 mg by mouth at bedtime. 07/27/14   Fay Records, MD  ibuprofen (ADVIL) 200 MG tablet Take 600 mg by mouth every 6 (six) hours as needed for headache or moderate pain.    [provider]  Insulin Glargine-Lixisenatide (SOLIQUA) 100-33 UNT-MCG/ML SOPN Inject 40 Units into the skin daily.    [provider]  levETIRAcetam (KEPPRA) 750 MG tablet Take 750 mg by mouth 2 (two) times daily.    [provider]  levothyroxine (SYNTHROID) 125 MCG tablet Take 125 mcg by mouth daily before breakfast.    [provider]  metFORMIN (GLUCOPHAGE) 500 MG tablet Take 1,000 mg by mouth 2 (two) times daily.    [provider]  metoprolol succinate (TOPROL-XL) 25 MG 24 hr tablet Take 25 mg by mouth daily.      [provider]  oxyCODONE (OXY IR/ROXICODONE) 5 MG immediate release tablet Take 1 tablet (5 mg total) by mouth every 6 (six) hours as needed for severe pain. 10/18/20   Greer Pickerel, MD  Promethazine-Codeine 6.25-10 MG/5ML SOLN Take 5 mLs by mouth every 6 (six) hours as needed for cough. 09/23/20   [provider]  testosterone cypionate (DEPOTESTOSTERONE CYPIONATE) 200 MG/ML injection Inject 200 mg into the muscle every 14 (fourteen) days.    [provider]    Allergies    Patient has no known allergies.  Review of Systems   Review of Systems  Constitutional:  Negative for fever.  HENT:  Negative for sore throat.   Eyes:  Negative for visual disturbance.  Respiratory:  Positive for shortness of breath. Negative for cough.   Cardiovascular:  Negative for chest pain.  Gastrointestinal:  Positive for abdominal pain. Negative for constipation, diarrhea, nausea and vomiting.  Genitourinary:  Negative for difficulty urinating.  Musculoskeletal:  Negative for back pain and neck stiffness.  Skin:  Negative for rash.  Neurological:  Negative for syncope and headaches.   Physical Exam Updated Vital Signs BP (!) 139/92   Pulse 86   Temp 98.7 F (37.1 C) (Oral)   Resp 19   Ht '5\' 11"'$  (1.803 m)   Wt 107 kg   SpO2 99%   BMI 32.92 kg/m   Physical Exam Vitals and nursing note reviewed.  Constitutional:      General: He is not in acute distress.    Appearance: He is well-developed. He is not diaphoretic.  HENT:     Head: Normocephalic and atraumatic.  Eyes:     Conjunctiva/sclera: Conjunctivae normal.  Cardiovascular:     Rate and Rhythm:  Normal rate and regular rhythm.     Heart sounds: Normal heart sounds. No murmur heard.   No friction rub. No gallop.  Pulmonary:     Effort: Pulmonary effort is normal. No respiratory distress.     Breath sounds: Normal breath sounds. No wheezing or rales.  Abdominal:     General: There is no distension.     Palpations: Abdomen is soft.     Tenderness: There is abdominal tenderness (epigastric, RUQ mild, tenderness over umbilical hernia). There is no guarding.  Musculoskeletal:     Cervical back: Normal range of motion.  Skin:    General: Skin is warm and dry.  Neurological:  Mental Status: He is alert and oriented to person, place, and time.    ED Results / Procedures / Treatments   Labs (all labs ordered are listed, but only abnormal results are displayed) Labs Reviewed  CBC WITH DIFFERENTIAL/PLATELET - Abnormal; Notable for the following components:      Result Value   RDW 15.9 (*)    Platelets 101 (*)    Eosinophils Absolute 0.9 (*)    Abs Immature Granulocytes 0.16 (*)    All other components within normal limits  COMPREHENSIVE METABOLIC PANEL - Abnormal; Notable for the following components:   Sodium 131 (*)    Chloride 97 (*)    Glucose, Bld 140 (*)    Creatinine, Ser 1.54 (*)    Calcium 8.8 (*)    GFR, Estimated 50 (*)    All other components within normal limits  RESP PANEL BY RT-PCR (FLU A&B, COVID) ARPGX2  LIPASE, BLOOD  BRAIN NATRIURETIC PEPTIDE  TROPONIN I (HIGH SENSITIVITY)  TROPONIN I (HIGH SENSITIVITY)    EKG None  Radiology CT Angio Chest PE W and/or Wo Contrast  Result Date: 11/28/2020 CLINICAL DATA:  Abdominal pain, acute nonlocalized abdominal pain and history of shortness of breath in a 65 year old male. EXAM: CT ANGIOGRAPHY CHEST CT ABDOMEN AND PELVIS WITH CONTRAST TECHNIQUE: Multidetector CT imaging of the chest was performed using the standard protocol during bolus administration of intravenous contrast. Multiplanar CT image  reconstructions and MIPs were obtained to evaluate the vascular anatomy. Multidetector CT imaging of the abdomen and pelvis was performed using the standard protocol during bolus administration of intravenous contrast. CONTRAST:  13m OMNIPAQUE IOHEXOL 350 MG/ML SOLN COMPARISON:  Comparison made with October 03, 2003. No recent comparison imaging is available. FINDINGS: CTA CHEST FINDINGS Cardiovascular: Three-vessel coronary artery disease. Normal heart size without substantial pericardial effusion. Aorta with calcified and noncalcified plaque. No signs of aneurysmal dilation. Limited evaluation due to phase of contrast enhancement for pulmonary embolism evaluation. Pulmonary arteries with adequate evaluation. Density approximately 527 Hounsfield units of main pulmonary artery. No pulmonary embolism. Mediastinum/Nodes: No adenopathy in the chest. No acute findings in the mediastinum. No hilar adenopathy. Lungs/Pleura: Tiny pulmonary nodule in the RIGHT upper lobe (image 21/6) 4 mm. No effusion. No consolidative process. No pneumothorax. Small lingular nodule (image 67/6) 4 mm airways are patent. Musculoskeletal: No acute musculoskeletal process about the bony thorax. Spinal degenerative changes. Healed rib fractures along the RIGHT chest. These involve the RIGHT fifth and sixth ribs. Review of the MIP images confirms the above findings. CT ABDOMEN and PELVIS FINDINGS Hepatobiliary: Liver with smooth contours. Mild stranding in the cholecystectomy bed with small amount of fluid in this area this measures 4.6 x 1.5 cm. No generalized ascites. No biliary duct distension. Collection beneath the RIGHT lenticular hemi liver (image 62/5) 2.7 x 1.2 cm. Pancreas: Pancreatic atrophy without signs of inflammation or ductal dilation. No visible lesion. Spleen: Unremarkable. Adrenals/Urinary Tract: 8 mm calculus in the RIGHT kidney lower pole. Cortical scarring of the bilateral kidneys. No suspicious renal lesion. Small cyst in  the RIGHT kidney interpolar aspect. No ureteral calculi. Urinary bladder under distended. Adrenal glands are normal. Stomach/Bowel: Gastrointestinal tract without signs of obstruction. Normal appendix. Collapse versus mild thickening of the ascending colon adjacent to gallbladder fossa. No stranding about the stomach. No acute small bowel process. Vascular/Lymphatic: Aortic atherosclerosis. No sign of aneurysm. Smooth contour of the IVC. There is no gastrohepatic or hepatoduodenal ligament lymphadenopathy. No retroperitoneal or mesenteric lymphadenopathy. No pelvic sidewall lymphadenopathy.  Reproductive: Post prostatectomy.  Post vasectomy. Other: Fat containing umbilical hernia with surrounding stranding. Stranding both within the fat in the hernia and in subjacent omental fat anterior to small bowel loops. No small bowel dilation or small bowel thickening. No free air. No ascites. Musculoskeletal: Spinal degenerative changes without acute or destructive bone process. Avascular necrosis LEFT femoral head. Review of the MIP images confirms the above findings. IMPRESSION: No evidence of pulmonary embolism or acute finding in the chest. Small pulmonary nodules largest in the lingula at 4 mm. No follow-up needed if patient is low-risk (and has no known or suspected primary neoplasm). Non-contrast chest CT can be considered in 12 months if patient is high-risk. This recommendation follows the consensus statement: Guidelines for Management of Incidental Pulmonary Nodules Detected on CT Images: From the Fleischner Society 2017; Radiology 2017; 284:228-243. Fat containing umbilical hernia with surrounding stranding. Stranding both within the fat in the hernia and in subjacent omental fat anterior to small bowel loops. Findings could represent an incarcerated inguinal hernia containing fat. Correlate with symptoms and the ability to reduce the hernia in this area. Fluid in the gallbladder the under fossa and along surface  of the RIGHT hemi liver. These may represent small resolving hematomas and or bilomas, correlate with any RIGHT upper quadrant symptoms. Adjacent hepatic flexure is collapsed with question of mild thickening. This may be secondary to inflammation in this location. Nonobstructing 8 mm calculus in the lower pole the RIGHT kidney with bilateral renal cortical scarring. Avascular necrosis LEFT femoral head. Aortic Atherosclerosis (ICD10-I70.0). Electronically Signed   By: Zetta Bills M.D.   On: 11/28/2020 17:53   CT ABDOMEN PELVIS W CONTRAST  Result Date: 11/28/2020 CLINICAL DATA:  Abdominal pain, acute nonlocalized abdominal pain and history of shortness of breath in a 65 year old male. EXAM: CT ANGIOGRAPHY CHEST CT ABDOMEN AND PELVIS WITH CONTRAST TECHNIQUE: Multidetector CT imaging of the chest was performed using the standard protocol during bolus administration of intravenous contrast. Multiplanar CT image reconstructions and MIPs were obtained to evaluate the vascular anatomy. Multidetector CT imaging of the abdomen and pelvis was performed using the standard protocol during bolus administration of intravenous contrast. CONTRAST:  194m OMNIPAQUE IOHEXOL 350 MG/ML SOLN COMPARISON:  Comparison made with October 03, 2003. No recent comparison imaging is available. FINDINGS: CTA CHEST FINDINGS Cardiovascular: Three-vessel coronary artery disease. Normal heart size without substantial pericardial effusion. Aorta with calcified and noncalcified plaque. No signs of aneurysmal dilation. Limited evaluation due to phase of contrast enhancement for pulmonary embolism evaluation. Pulmonary arteries with adequate evaluation. Density approximately 527 Hounsfield units of main pulmonary artery. No pulmonary embolism. Mediastinum/Nodes: No adenopathy in the chest. No acute findings in the mediastinum. No hilar adenopathy. Lungs/Pleura: Tiny pulmonary nodule in the RIGHT upper lobe (image 21/6) 4 mm. No effusion. No  consolidative process. No pneumothorax. Small lingular nodule (image 67/6) 4 mm airways are patent. Musculoskeletal: No acute musculoskeletal process about the bony thorax. Spinal degenerative changes. Healed rib fractures along the RIGHT chest. These involve the RIGHT fifth and sixth ribs. Review of the MIP images confirms the above findings. CT ABDOMEN and PELVIS FINDINGS Hepatobiliary: Liver with smooth contours. Mild stranding in the cholecystectomy bed with small amount of fluid in this area this measures 4.6 x 1.5 cm. No generalized ascites. No biliary duct distension. Collection beneath the RIGHT lenticular hemi liver (image 62/5) 2.7 x 1.2 cm. Pancreas: Pancreatic atrophy without signs of inflammation or ductal dilation. No visible lesion. Spleen: Unremarkable. Adrenals/Urinary Tract: 8  mm calculus in the RIGHT kidney lower pole. Cortical scarring of the bilateral kidneys. No suspicious renal lesion. Small cyst in the RIGHT kidney interpolar aspect. No ureteral calculi. Urinary bladder under distended. Adrenal glands are normal. Stomach/Bowel: Gastrointestinal tract without signs of obstruction. Normal appendix. Collapse versus mild thickening of the ascending colon adjacent to gallbladder fossa. No stranding about the stomach. No acute small bowel process. Vascular/Lymphatic: Aortic atherosclerosis. No sign of aneurysm. Smooth contour of the IVC. There is no gastrohepatic or hepatoduodenal ligament lymphadenopathy. No retroperitoneal or mesenteric lymphadenopathy. No pelvic sidewall lymphadenopathy. Reproductive: Post prostatectomy.  Post vasectomy. Other: Fat containing umbilical hernia with surrounding stranding. Stranding both within the fat in the hernia and in subjacent omental fat anterior to small bowel loops. No small bowel dilation or small bowel thickening. No free air. No ascites. Musculoskeletal: Spinal degenerative changes without acute or destructive bone process. Avascular necrosis LEFT  femoral head. Review of the MIP images confirms the above findings. IMPRESSION: No evidence of pulmonary embolism or acute finding in the chest. Small pulmonary nodules largest in the lingula at 4 mm. No follow-up needed if patient is low-risk (and has no known or suspected primary neoplasm). Non-contrast chest CT can be considered in 12 months if patient is high-risk. This recommendation follows the consensus statement: Guidelines for Management of Incidental Pulmonary Nodules Detected on CT Images: From the Fleischner Society 2017; Radiology 2017; 284:228-243. Fat containing umbilical hernia with surrounding stranding. Stranding both within the fat in the hernia and in subjacent omental fat anterior to small bowel loops. Findings could represent an incarcerated inguinal hernia containing fat. Correlate with symptoms and the ability to reduce the hernia in this area. Fluid in the gallbladder the under fossa and along surface of the RIGHT hemi liver. These may represent small resolving hematomas and or bilomas, correlate with any RIGHT upper quadrant symptoms. Adjacent hepatic flexure is collapsed with question of mild thickening. This may be secondary to inflammation in this location. Nonobstructing 8 mm calculus in the lower pole the RIGHT kidney with bilateral renal cortical scarring. Avascular necrosis LEFT femoral head. Aortic Atherosclerosis (ICD10-I70.0). Electronically Signed   By: Zetta Bills M.D.   On: 11/28/2020 17:53    Procedures Procedures   Medications Ordered in ED Medications  iohexol (OMNIPAQUE) 350 MG/ML injection 100 mL (100 mLs Intravenous Contrast Given 11/28/20 1651)    ED Course  I have reviewed the triage vital signs and the nursing notes.  Pertinent labs & imaging results that were available during my care of the patient were reviewed by me and considered in my medical decision making (see chart for details).    MDM Rules/Calculators/A&P                             65 year old male with a history of prostate cancer, coronary artery disease, diabetes, hyperlipidemia, seizure, laparoscopic cholecystectomy August 11, presents with concern for shortness of breath and also reports epigastric abdominal pain for 2 weeks.  Differential diagnosis for dyspnea includes ACS, PE, CHF exacerbation, anemia, pneumonia, viral etiology such as COVID 19 infection, metabolic abnormality.   EKG was evaluated by me which showed no acute findings.  BNP was normal. Denies CP, troponin negative x2, doubt ACS.  Given recent surgery and dyspnea with high risk for PE, CT PE study ordered and shows no sign of PE or other acute intrathoracic abnormalities.  Given abdominal pain, recent surgery, CT abdomen pelvis obtained.  CT  shows fluid in gallbladder fossa, possible resolving hematomas or bilomas, question inflammation of hepatic flexure as well as fat containing umbilica hernia with stranding in omentum.  Has mild tenderness over these locations.  Discussed with Dr. Brantley Stage of general surgery. Pt is afebrile, well appearing, no leukocytosis, no significant pain and feel it is reasonable for him to see General Surgery in next few days for follow up and evaluation in the setting of these findings.  Recommend PCP follow up regarding dyspnea. Patient discharged in stable condition with understanding of reasons to return.     Final Clinical Impression(s) / ED Diagnoses Final diagnoses:  Shortness of breath  Epigastric pain  Umbilical hernia without obstruction and without gangrene    Rx / DC Orders ED Discharge Orders     None        Gareth Morgan, MD 11/29/20 1209

## 2020-11-28 NOTE — ED Triage Notes (Signed)
Pt c/o shortness of breath onset last week. Pt seen by PCP on Tuesday had EKG and chest x-ray done. Pt has appointment with cardiology in December.

## 2020-11-28 NOTE — ED Notes (Signed)
Pt on auto VS and continuous pulse ox

## 2020-11-29 ENCOUNTER — Other Ambulatory Visit: Payer: Self-pay | Admitting: Orthopedic Surgery

## 2020-11-29 DIAGNOSIS — M25552 Pain in left hip: Secondary | ICD-10-CM

## 2020-12-07 ENCOUNTER — Other Ambulatory Visit: Payer: Medicare Other

## 2021-01-13 ENCOUNTER — Encounter: Payer: Self-pay | Admitting: Internal Medicine

## 2021-01-13 ENCOUNTER — Other Ambulatory Visit: Payer: Self-pay

## 2021-01-13 ENCOUNTER — Ambulatory Visit: Payer: Medicare Other | Admitting: Internal Medicine

## 2021-01-13 VITALS — BP 108/60 | HR 77 | Ht 72.0 in | Wt 232.4 lb

## 2021-01-13 DIAGNOSIS — I2510 Atherosclerotic heart disease of native coronary artery without angina pectoris: Secondary | ICD-10-CM | POA: Diagnosis not present

## 2021-01-13 DIAGNOSIS — R0602 Shortness of breath: Secondary | ICD-10-CM | POA: Diagnosis not present

## 2021-01-13 NOTE — Patient Instructions (Signed)
Medication Instructions:  Your physician recommends that you continue on your current medications as directed. Please refer to the Current Medication list given to you today.  *If you need a refill on your cardiac medications before your next appointment, please call your pharmacy*   Lab Work: none If you have labs (blood work) drawn today and your tests are completely normal, you will receive your results only by: [place] (if you have MyChart) OR A paper copy in the mail If you have any lab test that is abnormal or we need to change your treatment, we will call you to review the results.   Testing/Procedures: Your physician has requested that you have an echocardiogram. Echocardiography is a painless test that uses sound waves to create images of your heart. It provides your doctor with information about the size and shape of your heart and how well your heart's chambers and valves are working. This procedure takes approximately one hour. There are no restrictions for this procedure.    Follow-Up: At [facility], you and your health needs are our priority.  As part of our continuing mission to provide you with exceptional heart care, we have created designated Provider Care Teams.  These Care Teams include your primary Cardiologist (physician) and Advanced Practice Providers (APPs -  Physician Assistants and Nurse Practitioners) who all work together to provide you with the care you need, when you need it.  We recommend signing up for the patient portal called "MyChart".  Sign up information is provided on this After Visit Summary.  MyChart is used to connect with patients for Virtual Visits (Telemedicine).  Patients are able to view lab/test results, encounter notes, upcoming appointments, etc.  Non-urgent messages can be sent to your provider as well.   To learn more about what you can do with MyChart, go to [email or web address].    Your next appointment:   7 month(s) May  2023  The format for your next appointment:   In Person  Provider:   Dorris Carnes, MD   Other Instructions

## 2021-01-13 NOTE — Progress Notes (Signed)
Cardiology Office Note   Date:  01/13/2021   ID:  [name], DOB May 15, 1955, MRN [id number]  PCP:  Burnard Bunting, MD  Cardiologist:   Dorris Carnes, MD    F/U of CAD      History of Present Illness: [name] is a 65 y.o. male with a history of CAD, HL, DM, pericardial cyst   He is status post ST elevation MI in April 2007 has 3 bare-metal stents to the circumflex. Repeat catheterization 2009 showed a 60-65% mid RCA stenosis; small RV branch with 80% stenosis. D2 had a 70% ostial lesion. D1 40%. The stents in the LCX had 50% instent proximally.LVEF 55% overall unchanged.    I saw the pt in [place] last in Nov 2021  The pt says he hasnt been feeling as good lately   Switch in medicine insurance coverage lead to dosing error in synthroid   Back on dose he should bee   The pt says he feels sluggish   SOB with activity    Deniese CP   No dizziness      Diet:  Toast  (white, jelly butter, cinammon) Lunch:   Fast food   Publishing copy;  Chief Technology Officer    1/2 cheerwine or1/2 sunkist  Outpatient Medications Prior to Visit  Medication Sig Dispense Refill   Aspirin-Caffeine (BAYER BACK & BODY) 500-32.5 MG TABS Take 1-2 tablets by mouth every 6 (six) hours as needed (pain).     clopidogrel (PLAVIX) 75 MG tablet Take 1 tablet (75 mg total) by mouth daily.     CRESTOR 10 MG tablet take 1 tablet by mouth at bedtime (Patient taking differently: Take 10 mg by mouth at bedtime.) 30 tablet 3   ibuprofen (ADVIL) 200 MG tablet Take 600 mg by mouth every 6 (six) hours as needed for headache or moderate pain.     Insulin Glargine-Lixisenatide (SOLIQUA) 100-33 UNT-MCG/ML SOPN Inject 40 Units into the skin daily.     levETIRAcetam (KEPPRA) 750 MG tablet Take 750 mg by mouth 2 (two) times daily.     levothyroxine (SYNTHROID) 125 MCG tablet Take 125 mcg by mouth daily before breakfast.     metFORMIN (GLUCOPHAGE) 500 MG tablet Take 1,000 mg by mouth 2 (two) times daily.      metoprolol succinate (TOPROL-XL) 25 MG 24 hr tablet Take 25 mg by mouth daily.       oxyCODONE (OXY IR/ROXICODONE) 5 MG immediate release tablet Take 1 tablet (5 mg total) by mouth every 6 (six) hours as needed for severe pain. 12 tablet 0   Promethazine-Codeine 6.25-10 MG/5ML SOLN Take 5 mLs by mouth every 6 (six) hours as needed for cough.     testosterone cypionate (DEPOTESTOSTERONE CYPIONATE) 200 MG/ML injection Inject 200 mg into the muscle every 14 (fourteen) days.     No facility-administered medications prior to visit.     Allergies:   Patient has no known allergies.   Past Medical History:  Diagnosis Date   Cancer [facility])    prostate   Coronary artery disease    Diabetes mellitus    type 2   Diabetic amyotrophy ([place]) 01/08/2018   Right leg   Diabetic neuropathy ([place]) 01/08/2018   feet   History of kidney stones    Hyperlipidemia    Myocardial infarction [facility]) 2007   Other iatrogenic hypothyroidism    Seizure ([place])    last seizure 2000 due to vasculitis  Past Surgical History:  Procedure Laterality Date   BRAIN SURGERY  brain biopsy   2000 to figure out cause of headaches dx with vasculitis   CARDIAC CATHETERIZATION  06/22/2005   3 stents   CHOLECYSTECTOMY N/A 10/18/2020   Procedure: LAPAROSCOPIC CHOLECYSTECTOMY;  Surgeon: Greer Pickerel, MD;  Location: [facility];  Service: General;  Laterality: N/A;   PELVIC LYMPH NODE DISSECTION Bilateral 12/11/2018   Procedure: PELVIC LYMPH NODE DISSECTION;  Surgeon: Ardis Hughs, MD;  Location: [facility];  Service: Urology;  Laterality: Bilateral;   PROSTATE BIOPSY  11/1998   ROBOT ASSISTED LAPAROSCOPIC RADICAL PROSTATECTOMY N/A 12/11/2018   Procedure: XI ROBOTIC ASSISTED LAPAROSCOPIC RADICAL PROSTATECTOMY;  Surgeon: Ardis Hughs, MD;  Location: [facility];  Service: Urology;  Laterality: N/A;   rotater cuff surgery Left 01/17/2017     Social History:  The patient  reports that he has never smoked. He has never used  smokeless tobacco. He reports current alcohol use. He reports that he does not use drugs.   Family History:  The patient's family history includes Heart attack in his father and mother.    ROS:  Please see the history of present illness. All other systems are reviewed and  Negative to the above problem except as noted.    PHYSICAL EXAM: VS:  BP 108/60   Pulse 77   Ht 6' (1.829 m)   Wt 232 lb 6.4 oz (105.4 kg)   SpO2 98%   BMI 31.52 kg/m   GEN: Obese 65 yo  in no acute distress  HEENT: normal  Neck: NEck Full  No obvious JVD No carotid  bruits Cardiac: RRR; no murmurs.  No LE edema  Respiratory:  clear to auscultation bilaterally, GI: soft, nontender, nondistended, + BS  No hepatomegaly  MS: no deformity Moving all extremities   Skin: warm and dry, no rash Neuro:  Strength and sensation are intact Psych: euthymic mood, full affect   EKG:  EKG not ordered    Lipid Panel    Component Value Date/Time   CHOL 104 04/19/2009 0837   TRIG 152.0 (H) 04/19/2009 0837   HDL 31.40 (L) 04/19/2009 0837   CHOLHDL 3 04/19/2009 0837   VLDL 30.4 04/19/2009 0837   LDLCALC 42 04/19/2009 0837      Wt Readings from Last 3 Encounters:  01/13/21 232 lb 6.4 oz (105.4 kg)  11/28/20 236 lb (107 kg)  10/18/20 225 lb (102.1 kg)      ASSESSMENT AND PLAN:  1  CAD   No chest pain but has had some fatigue   Attrib to thyroid not being right   Got the new dose, will start now to start   Would sched an echo with strain     Follow   I am not convinced this represents anginal equivalent  2  HL  Continue Crestor  Need labs from [place]  3  HTN  BP is well controlled on current meds  Continue  4  Obesity  Discussed diet at length   Cut out carbs   TRE  F/U next spring    Sooner for changes      Current medicines are reviewed at length with the patient today.  The patient does not have concerns regarding medicines.  Signed, Dorris Carnes, MD  01/13/2021 10:08 AM    [address], [place], [place]  [address] Phone: [phone]; Fax: [phone]

## 2021-02-03 ENCOUNTER — Other Ambulatory Visit: Payer: Self-pay

## 2021-02-03 ENCOUNTER — Ambulatory Visit (HOSPITAL_COMMUNITY): Payer: Medicare Other | Attending: Cardiology

## 2021-02-03 DIAGNOSIS — R0602 Shortness of breath: Secondary | ICD-10-CM

## 2021-02-03 DIAGNOSIS — I2510 Atherosclerotic heart disease of native coronary artery without angina pectoris: Secondary | ICD-10-CM | POA: Insufficient documentation

## 2021-02-03 LAB — ECHOCARDIOGRAM COMPLETE
Area-P 1/2: 4.31 cm2
P 1/2 time: 394 msec
S' Lateral: 2.6 cm

## 2021-04-04 ENCOUNTER — Telehealth: Payer: Self-pay | Admitting: *Deleted

## 2021-04-04 NOTE — Telephone Encounter (Signed)
° °  Pre-operative Risk Assessment    Patient Name: [name]  DOB: 1955-03-26 MRN: [id number]      Request for Surgical Clearance    Procedure:   RIGHT CUBITAL AND CARPAL TUNNEL RELEASE   Date of Surgery:  Clearance 04/14/21                                 Surgeon:  DR. [name] Surgeon's Group or Practice Name:  [facility] Phone number:  [phone] Fax number:  [phone] ATTN: ASHLEY HILTON   Type of Clearance Requested:   - Medical  - Pharmacy:  Hold Clopidogrel (Plavix)     Type of Anesthesia:   REGIONAL    Additional requests/questions:    [name]   04/04/2021, 2:03 PM

## 2021-04-04 NOTE — Telephone Encounter (Signed)
[name] 66 year old male is requesting preoperative cardiac evaluation for right cubital and carpal tunnel release.  He was last seen in the clinic on 01/13/2021.  During that time he continued to do well from a cardiac standpoint.  His PMH includes coronary artery disease, hyperlipidemia, diabetes, pericardial cyst, and shortness of breath.  May his Plavix be held prior to his surgery?  Thank you for your help.  Please direct response to CV DIV preop pool.  Jossie Ng. Hafiz Irion NP-C    04/04/2021, 2:40 PM [place] [address] Office [phone] Fax [phone]

## 2021-04-05 NOTE — Telephone Encounter (Signed)
° °  Primary Cardiologist: Dorris Carnes, MD  Chart reviewed as part of pre-operative protocol coverage. Given past medical history and time since last visit, based on ACC/AHA guidelines, [name] would be at acceptable risk for the planned procedure without further cardiovascular testing.   His Plavix may be held for 5 days prior to his procedure.  Please resume as soon as hemostasis is achieved.  I will route this recommendation to the requesting party via Epic fax function and remove from pre-op pool.  Please call with questions.  [name]. Paul Battey NP-C    04/05/2021, 9:00 AM [place] [address] Office [phone] Fax [phone]

## 2021-04-12 ENCOUNTER — Encounter (HOSPITAL_BASED_OUTPATIENT_CLINIC_OR_DEPARTMENT_OTHER): Payer: Self-pay | Admitting: Orthopedic Surgery

## 2021-04-12 ENCOUNTER — Other Ambulatory Visit: Payer: Self-pay

## 2021-04-12 NOTE — Progress Notes (Signed)
Spoke w/ via phone for pre-op interview--- Lab needs dos---- [employer] results------ current ekg in epic/ chart COVID test -----patient states asymptomatic no test needed Arrive at ------- 0730 on 04-14-2021 NPO after MN NO Solid Food.  Clear liquids from MN until--- 0630 Med rec completed Medications to take morning of surgery ----- toprol, synthroid, keppra Diabetic medication ----- do not take metformin and do not do insulin morning of surgery Patient instructed no nail polish to be worn day of surgery Patient instructed to bring photo id and insurance card day of surgery Patient aware to have Driver (ride ) / caregiver  for 24 hours after surgery --wife, [place] Patient Special Instructions ----- n/a Pre-Op special Istructions ----- pt has telephone cardiac clearance by Coletta Memos NP on 04-05-2021 in epic/ chart Pt uses freestyle libre Patient verbalized understanding of instructions that were given at this phone interview. Patient denies shortness of breath, chest pain, fever, cough at this phone interview.    Anesthesia Review: HTN;  CAD hx STEMI 04/ 2007 w/ PTCA x3 BMS to CFx (last cath 11/ 2009 3 vessel disease w/ patent stents with no change IRS ;  hx CNS vasculitis in 2000 s/p craniotomy resection left frontal with residual seizure's , pt stated last seizure in 2000 , no break through while on keppra;  DM 2;   Pt denies cardiac s&a, sob, and no peripheral  PCP: Dr Reynaldo Minium  Cardiologist : Dr Mamie Nick. Rudean Haskell 01-13-2021 epic) Chest x-ray :  CT 11-28-2020 epic EKG : 11-28-2020 epic Echo : 02-03-2021 epic Stress test: 01-19-2003 epic Cardiac Cath :  02-12-2008 epic Activity level: denies sob w/ any activity Sleep Study/ CPAP : no Fasting Blood Sugar : 110--120     / Checks Blood Sugar -- times a day: multiple times Blood Thinner/ Instructions /Last Dose: Plavix ASA / Instructions/ Last Dose :  no Pt stated was given instructions to stop plavix, stated last dose  04-04-2021

## 2021-04-13 NOTE — Discharge Instructions (Addendum)
Orthopaedic Hand Surgery Discharge Instructions  WEIGHT BEARING STATUS: Non weight bearing on operative extremity  DRESSING CARE: Please keep your dressing/splint/cast clean and dry until your follow-up appointment. You may shower by placing a waterproof covering over your dressing/splint/cast. Contact your surgeon if your splint/cast gets wet. It will need to be changed to prevent skin breakdown.  PAIN CONTROL: First line medications for post operative pain control are Tylenol (acetaminophen) and Motrin (ibuprofen) if you are able to take these medications. If you have been prescribed a medication these can be taken as breakthrough pain medications. Please note that some narcotic pain medication has acetaminophen added and you should never consume more than 4,000mg  of acetaminophen in 24-hour period. Please note that if you are given Toradol (ketorolac) you should not take similar medications such as ibuprofen or naproxen.  DISCHARGE MEDICATIONS: If you have been prescribed medication it was sent electronically to your pharmacy. No changes have been made to your home medications.  ICE/ELEVATION: Ice and elevate your injured extremity as needed. Avoid direct contact of ice with skin.   BANDAGE FEELS TOO TIGHT: If your bandage feels too tight, first make sure you are elevating your fingers as much as possible. The outer layer of the bandage can be unwrapped and reapplied more loosely. If no improvement, you may carefully cut the inner layer longitudinally until the pressure has resolved and then rewrap the outer layer. If you are not comfortable with these instructions, please call the office and the bandage can be changed for you.   FOLLOW UP: You will be called after surgery with an appointment date and time, however if you have not received a phone call within 3 days, please call during regular office hours at [phone] to schedule a post operative appointment.  Please Seek Medical Attention  if: Call MD for: pain or pressure in chest, jaw, arm, back, neck  Call MD for: temperature greater than 101 F for more than 24 hrs Call MD for: difficulty breathing Call MD for: incision redness, bleeding, drainage  Call MD for: palpitations or feeling that the heart is racing  Call MD for: increased swelling in arm, leg, ankle, or abdomen  Call MD for: lightheadedness, dizziness, fainting Call 911 or go to ER for any medical emergency if you are not able to get in touch with your doctor   J. Sable Feil, MD Orthopaedic Hand Surgeon [facility] Office number: [phone] [address] [place], [place] [address]     Post Anesthesia Home Care Instructions  Activity: Get plenty of rest for the remainder of the day. A responsible individual must stay with you for 24 hours following the procedure.  For the next 24 hours, DO NOT: -Drive a car -Paediatric nurse -Drink alcoholic beverages -Take any medication unless instructed by your physician -Make any legal decisions or sign important papers.  Meals: Start with liquid foods such as gelatin or soup. Progress to regular foods as tolerated. Avoid greasy, spicy, heavy foods. If nausea and/or vomiting occur, drink only clear liquids until the nausea and/or vomiting subsides. Call your physician if vomiting continues.  Special Instructions/Symptoms: Your throat may feel dry or sore from the anesthesia or the breathing tube placed in your throat during surgery. If this causes discomfort, gargle with warm salt water. The discomfort should disappear within 24 hours.      Do not take any Tylenol until after 2:15 pm today if needed.   Regional Anesthesia Blocks  1. Numbness or the inability to  move the "blocked" extremity may last from 3-48 hours after placement. The length of time depends on the medication injected and your individual response to the medication. If the numbness is not going away after 48 hours, call your  surgeon.  2. The extremity that is blocked will need to be protected until the numbness is gone and the  Strength has returned. Because you cannot feel it, you will need to take extra care to avoid injury. Because it may be weak, you may have difficulty moving it or using it. You may not know what position it is in without looking at it while the block is in effect.  3. For blocks in the legs and feet, returning to weight bearing and walking needs to be done carefully. You will need to wait until the numbness is entirely gone and the strength has returned. You should be able to move your leg and foot normally before you try and bear weight or walk. You will need someone to be with you when you first try to ensure you do not fall and possibly risk injury.  4. Bruising and tenderness at the needle site are common side effects and will resolve in a few days.  5. Persistent numbness or new problems with movement should be communicated to the surgeon or the [place] [phone]).

## 2021-04-13 NOTE — H&P (Signed)
Preoperative History & Physical Exam  Surgeon: Matt Holmes, MD  Diagnosis: Right Carpal Tunnel Syndrome, Right cubital tunnel syndrome  Planned Procedure: Procedure(s) (LRB): Right cubital tunnel release in situ, possible transposition (Right) Right Carpal Tunnel Release (Right)  History of Present Illness:   Patient is a 66 y.o. male with symptoms consistent with Right Carpal Tunnel Syndrome, Right cubital tunnel syndrome who presents for surgical intervention. The risks, benefits and alternatives of surgical intervention were discussed and informed consent was obtained prior to surgery.  Past Medical History:  Past Medical History:  Diagnosis Date   Arthritis    Carpal tunnel syndrome, bilateral    Coronary artery disease 06/2005   cardiologist--- dr Lizbeth Bark;  hx STEMI 04/ 2007 s/p cath with PTCA and x3 BMS to CFx;   last cath 02-12-2008 stable triple vessel disease, patent LFx stents with no change in IRS, preserved LVF, medically managed   Diabetic amyotrophy ([facility])    Right leg   Diabetic neuropathy ([place])    feet   History of COVID-19 2022   per pt summer 2022 w/ mild  symptoms that resolved   History of kidney stones    History of seizures    last seizure 2000 due to vasculitis   History of ST elevation myocardial infarction (STEMI) 06/22/2005   s/p cardiac cath w/ PTCA and stenting to CFx   Hyperlipidemia    Hypogonadism in male    Malignant neoplasm prostate [facility])    urologist---  dr Louis Meckel   Nephrolithiasis    right renal stone nonobstructive per CT 11-28-2020   Nocturia    Other iatrogenic hypothyroidism    followed by pcp  (04-12-2021  pt denies being dx hyperthyroidism, no thyroid surgery, and no RAI)   Pericardial cyst 07/23/2007   benign per MRI in epic and cardiologist note   Primary CNS vasculitis ([place]) 2000   followed by pcp per pt ,  previously followed at [facility],  ;   s/p left frontal resection , bx ,  residual post surgery seizures controlled with  medication   S/p bare metal coronary artery stent 06/22/2005   x3  to CFx   Type 2 diabetes mellitus ([place])    Wears glasses     Past Surgical History:  Past Surgical History:  Procedure Laterality Date   CARDIAC CATHETERIZATION  12/21/2006   @[facility]  by dr Lia Foyer;  preserved LVF , previous stents patent , and nonobstructive disease of other vessels   CARDIAC CATHETERIZATION  02/12/2008   @[facility]  by dr Lia Foyer;   mRCA 60-65%, small RV branch 80%, ostialD2 70%,  D1 40%,  LFx stents 50% IRS,  overall unchanged,  normal LVF, ef 55%   CHOLECYSTECTOMY N/A 10/18/2020   Procedure: LAPAROSCOPIC CHOLECYSTECTOMY;  Surgeon: Greer Pickerel, MD;  [facility];  Service: General;  Laterality: N/A;   CORONARY ANGIOPLASTY WITH STENT PLACEMENT  06/22/2005   @[facility]  by dr Lia Foyer;   PTCA and BMS x3 to LCx   CRANIOTOMY  2000   @[facility] ;   Left frontal resection for biopsy   CYSTOSCOPY/RETROGRADE/URETEROSCOPY/STONE EXTRACTION WITH BASKET  10/06/2003   @[facility]    PELVIC LYMPH NODE DISSECTION Bilateral 12/11/2018   Procedure: PELVIC LYMPH NODE DISSECTION;  Surgeon: Ardis Hughs, MD;  [facility];  Service: Urology;  Laterality: Bilateral;   ROBOT ASSISTED LAPAROSCOPIC RADICAL PROSTATECTOMY N/A 12/11/2018   Procedure: XI ROBOTIC ASSISTED LAPAROSCOPIC RADICAL PROSTATECTOMY;  Surgeon: Ardis Hughs, MD;  [facility];  Service: Urology;  Laterality:  N/A;   SHOULDER ARTHROSCOPY WITH OPEN ROTATOR CUFF REPAIR Left 2018   SHOULDER ARTHROSCOPY WITH SUBACROMIAL DECOMPRESSION Right 07/08/2001   @[facility]     Medications:  Prior to Admission medications   Medication Sig Start Date End Date Taking? Authorizing Provider  Aspirin-Caffeine (BAYER BACK & BODY) 500-32.5 MG TABS Take 1-2 tablets by mouth every 6 (six) hours as needed (pain).   Yes [provider]  clopidogrel (PLAVIX) 75 MG tablet Take 1 tablet (75 mg total) by mouth daily. 10/21/20  Yes Greer Pickerel, MD  CRESTOR 10 MG tablet take 1 tablet by  mouth at bedtime Patient taking differently: Take 10 mg by mouth at bedtime. 07/27/14  Yes Fay Records, MD  ibuprofen (ADVIL) 200 MG tablet Take 600 mg by mouth every 6 (six) hours as needed for headache or moderate pain.   Yes [provider]  Insulin Glargine-Lixisenatide (SOLIQUA) 100-33 UNT-MCG/ML SOPN Inject 40 Units into the skin daily.   Yes [provider]  levETIRAcetam (KEPPRA) 750 MG tablet Take 750 mg by mouth 2 (two) times daily.   Yes [provider]  levothyroxine (SYNTHROID) 125 MCG tablet Take 125 mcg by mouth daily before breakfast.   Yes [provider]  metFORMIN (GLUCOPHAGE) 500 MG tablet Take 1,000 mg by mouth 2 (two) times daily.   Yes [provider]  metoprolol succinate (TOPROL-XL) 25 MG 24 hr tablet Take 25 mg by mouth daily.   Yes [provider]  oxyCODONE (OXY IR/ROXICODONE) 5 MG immediate release tablet Take 1 tablet (5 mg total) by mouth every 6 (six) hours as needed for severe pain. 10/18/20  Yes Greer Pickerel, MD  testosterone cypionate (DEPOTESTOSTERONE CYPIONATE) 200 MG/ML injection Inject 200 mg into the muscle every 14 (fourteen) days.   Yes [provider]    Allergies:  Patient has no known allergies.  Review of Systems: Negative except per HPI.  Physical Exam: Alert and oriented, NAD Head and neck: no masses, normal alignment CV: pulse intact Pulm: no increased work of breathing, respirations even and unlabored Abdomen: non-distended Extremities: extremities warm and well perfused  LABS: Recent Results (from the past 2160 hour(s))  ECHOCARDIOGRAM COMPLETE     Status: None   Collection Time: 02/03/21 12:09 PM  Result Value Ref Range   Area-P 1/2 4.31 cm2   S' Lateral 2.60 cm   P 1/2 time 394 msec     Complete History and Physical exam available in the office notes  [name]

## 2021-04-14 ENCOUNTER — Encounter (HOSPITAL_BASED_OUTPATIENT_CLINIC_OR_DEPARTMENT_OTHER)
Admission: RE | Disposition: A | Discharge status: Home / Self Care | Payer: Self-pay | Source: Ambulatory Visit | Attending: Orthopedic Surgery

## 2021-04-14 ENCOUNTER — Ambulatory Visit (HOSPITAL_BASED_OUTPATIENT_CLINIC_OR_DEPARTMENT_OTHER): Payer: Medicare Other | Admitting: Anesthesiology

## 2021-04-14 ENCOUNTER — Other Ambulatory Visit: Payer: Self-pay

## 2021-04-14 ENCOUNTER — Encounter (HOSPITAL_BASED_OUTPATIENT_CLINIC_OR_DEPARTMENT_OTHER): Payer: Self-pay | Admitting: Orthopedic Surgery

## 2021-04-14 ENCOUNTER — Ambulatory Visit (HOSPITAL_BASED_OUTPATIENT_CLINIC_OR_DEPARTMENT_OTHER)
Admission: RE | Admit: 2021-04-14 | Discharge: 2021-04-14 | Disposition: A | Discharge status: Home / Self Care | Payer: Medicare Other | Source: Ambulatory Visit | Attending: Orthopedic Surgery | Admitting: Orthopedic Surgery

## 2021-04-14 DIAGNOSIS — Z955 Presence of coronary angioplasty implant and graft: Secondary | ICD-10-CM | POA: Diagnosis not present

## 2021-04-14 DIAGNOSIS — G5621 Lesion of ulnar nerve, right upper limb: Secondary | ICD-10-CM | POA: Diagnosis not present

## 2021-04-14 DIAGNOSIS — I252 Old myocardial infarction: Secondary | ICD-10-CM | POA: Diagnosis not present

## 2021-04-14 DIAGNOSIS — I2510 Atherosclerotic heart disease of native coronary artery without angina pectoris: Secondary | ICD-10-CM | POA: Diagnosis not present

## 2021-04-14 DIAGNOSIS — G5601 Carpal tunnel syndrome, right upper limb: Secondary | ICD-10-CM

## 2021-04-14 DIAGNOSIS — E119 Type 2 diabetes mellitus without complications: Secondary | ICD-10-CM | POA: Insufficient documentation

## 2021-04-14 HISTORY — DX: Testicular hypofunction: E29.1

## 2021-04-14 HISTORY — DX: Personal history of other specified conditions: Z87.898

## 2021-04-14 HISTORY — PX: ULNAR TUNNEL RELEASE: SHX820

## 2021-04-14 HISTORY — DX: Calculus of kidney: N20.0

## 2021-04-14 HISTORY — DX: Type 2 diabetes mellitus without complications: E11.9

## 2021-04-14 HISTORY — PX: CARPAL TUNNEL RELEASE: SHX101

## 2021-04-14 HISTORY — DX: Nocturia: R35.1

## 2021-04-14 HISTORY — DX: Malignant neoplasm of prostate: C61

## 2021-04-14 HISTORY — DX: Carpal tunnel syndrome, bilateral upper limbs: G56.03

## 2021-04-14 HISTORY — DX: Presence of spectacles and contact lenses: Z97.3

## 2021-04-14 HISTORY — DX: Unspecified osteoarthritis, unspecified site: M19.90

## 2021-04-14 LAB — POCT I-STAT, CHEM 8
BUN: 19 mg/dL (ref 8–23)
Calcium, Ion: 1.21 mmol/L (ref 1.15–1.40)
Chloride: 107 mmol/L (ref 98–111)
Creatinine, Ser: 1.3 mg/dL — ABNORMAL HIGH (ref 0.61–1.24)
Glucose, Bld: 115 mg/dL — ABNORMAL HIGH (ref 70–99)
HCT: 27 % — ABNORMAL LOW (ref 39.0–52.0)
Hemoglobin: 9.2 g/dL — ABNORMAL LOW (ref 13.0–17.0)
Potassium: 4.1 mmol/L (ref 3.5–5.1)
Sodium: 142 mmol/L (ref 135–145)
TCO2: 22 mmol/L (ref 22–32)

## 2021-04-14 LAB — GLUCOSE, CAPILLARY: Glucose-Capillary: 115 mg/dL — ABNORMAL HIGH (ref 70–99)

## 2021-04-14 SURGERY — RELEASE, CUBITAL TUNNEL
Anesthesia: Monitor Anesthesia Care | Site: Hand | Laterality: Right

## 2021-04-14 MED ORDER — LIDOCAINE HCL (PF) 2 % IJ SOLN
INTRAMUSCULAR | Status: AC
  Filled 2021-04-14: qty 5

## 2021-04-14 MED ORDER — LIDOCAINE HCL (CARDIAC) PF 100 MG/5ML IV SOSY
PREFILLED_SYRINGE | INTRAVENOUS | Status: DC | PRN
  Administered 2021-04-14: 40 mg via INTRAVENOUS

## 2021-04-14 MED ORDER — PROPOFOL 500 MG/50ML IV EMUL
INTRAVENOUS | Status: DC | PRN
  Administered 2021-04-14: 75 ug/kg/min via INTRAVENOUS

## 2021-04-14 MED ORDER — CEFAZOLIN SODIUM-DEXTROSE 2-4 GM/100ML-% IV SOLN
INTRAVENOUS | Status: AC
  Filled 2021-04-14: qty 100

## 2021-04-14 MED ORDER — LACTATED RINGERS IV SOLN: INTRAVENOUS | Status: DC

## 2021-04-14 MED ORDER — BACITRACIN ZINC 500 UNIT/GM EX OINT
TOPICAL_OINTMENT | CUTANEOUS | Status: DC | PRN
  Administered 2021-04-14: 1 via TOPICAL

## 2021-04-14 MED ORDER — LIDOCAINE HCL (PF) 1 % IJ SOLN
INTRAMUSCULAR | Status: DC | PRN
  Administered 2021-04-14: 20 mL

## 2021-04-14 MED ORDER — MIDAZOLAM HCL 5 MG/5ML IJ SOLN
INTRAMUSCULAR | Status: DC | PRN
Start: 2021-04-14 — End: 2021-04-14
  Administered 2021-04-14: 1 mg via INTRAVENOUS

## 2021-04-14 MED ORDER — FENTANYL CITRATE (PF) 100 MCG/2ML IJ SOLN
INTRAMUSCULAR | Status: AC
  Filled 2021-04-14: qty 2

## 2021-04-14 MED ORDER — MIDAZOLAM HCL 2 MG/2ML IJ SOLN
INTRAMUSCULAR | Status: AC
  Filled 2021-04-14: qty 2

## 2021-04-14 MED ORDER — ACETAMINOPHEN 500 MG PO TABS
1000.0000 mg | ORAL_TABLET | Freq: Once | ORAL | Status: AC
  Administered 2021-04-14: 1000 mg via ORAL

## 2021-04-14 MED ORDER — METOPROLOL TARTRATE 25 MG PO TABS
25.0000 mg | ORAL_TABLET | Freq: Once | ORAL | Status: AC
  Administered 2021-04-14: 25 mg via ORAL

## 2021-04-14 MED ORDER — PROPOFOL 1000 MG/100ML IV EMUL
INTRAVENOUS | Status: AC
  Filled 2021-04-14: qty 100

## 2021-04-14 MED ORDER — PROPOFOL 10 MG/ML IV BOLUS
INTRAVENOUS | Status: AC
  Filled 2021-04-14: qty 20

## 2021-04-14 MED ORDER — BUPIVACAINE-EPINEPHRINE (PF) 0.5% -1:200000 IJ SOLN
INTRAMUSCULAR | Status: DC | PRN
  Administered 2021-04-14: 30 mL via PERINEURAL

## 2021-04-14 MED ORDER — ACETAMINOPHEN 500 MG PO TABS
ORAL_TABLET | ORAL | Status: AC
  Filled 2021-04-14: qty 2

## 2021-04-14 MED ORDER — FENTANYL CITRATE (PF) 100 MCG/2ML IJ SOLN
100.0000 ug | Freq: Once | INTRAMUSCULAR | Status: AC
  Administered 2021-04-14: 100 ug via INTRAVENOUS

## 2021-04-14 MED ORDER — 0.9 % SODIUM CHLORIDE (POUR BTL) OPTIME
TOPICAL | Status: DC | PRN
  Administered 2021-04-14 (×2): 500 mL

## 2021-04-14 MED ORDER — ONDANSETRON HCL 4 MG/2ML IJ SOLN
INTRAMUSCULAR | Status: AC
  Filled 2021-04-14: qty 2

## 2021-04-14 MED ORDER — CEFAZOLIN SODIUM-DEXTROSE 2-4 GM/100ML-% IV SOLN
2.0000 g | INTRAVENOUS | Status: AC
  Administered 2021-04-14: 2 g via INTRAVENOUS

## 2021-04-14 MED ORDER — ONDANSETRON HCL 4 MG/2ML IJ SOLN
INTRAMUSCULAR | Status: DC | PRN
  Administered 2021-04-14: 4 mg via INTRAVENOUS

## 2021-04-14 MED ORDER — METOPROLOL TARTRATE 25 MG PO TABS
ORAL_TABLET | ORAL | Status: AC
  Filled 2021-04-14: qty 1

## 2021-04-14 MED ORDER — MIDAZOLAM HCL 2 MG/2ML IJ SOLN
1.0000 mg | Freq: Once | INTRAMUSCULAR | Status: AC
  Administered 2021-04-14: 1 mg via INTRAVENOUS

## 2021-04-14 SURGICAL SUPPLY — 54 items
BLADE SURG 15 STRL LF DISP TIS (BLADE) ×2 IMPLANT
BLADE SURG 15 STRL SS (BLADE) ×3
BNDG CMPR 9X4 STRL LF SNTH (GAUZE/BANDAGES/DRESSINGS) ×2
BNDG ELASTIC 4X5.8 VLCR STR LF (GAUZE/BANDAGES/DRESSINGS) ×4 IMPLANT
BNDG ESMARK 4X9 LF (GAUZE/BANDAGES/DRESSINGS) ×3 IMPLANT
CORD BIPOLAR FORCEPS 12FT (ELECTRODE) ×1 IMPLANT
COVER BACK TABLE 60X90IN (DRAPES) ×3 IMPLANT
CUFF TOURN SGL QUICK 18 (TOURNIQUET CUFF) ×3 IMPLANT
CUFF TOURN SGL QUICK 18X4 (TOURNIQUET CUFF) ×3 IMPLANT
DECANTER SPIKE VIAL GLASS SM (MISCELLANEOUS) IMPLANT
DRAPE EXTREMITY T 121X128X90 (DISPOSABLE) ×3 IMPLANT
DRAPE SHEET LG 3/4 BI-LAMINATE (DRAPES) ×3 IMPLANT
DRSG EMULSION OIL 3X3 NADH (GAUZE/BANDAGES/DRESSINGS) ×3 IMPLANT
GAUZE 4X4 16PLY ~~LOC~~+RFID DBL (SPONGE) ×3 IMPLANT
GAUZE SPONGE 4X4 12PLY STRL (GAUZE/BANDAGES/DRESSINGS) ×3 IMPLANT
GAUZE SPONGE 4X4 12PLY STRL LF (GAUZE/BANDAGES/DRESSINGS) ×1 IMPLANT
GLOVE SURG UNDER POLY LF SZ7.5 (GLOVE) ×3 IMPLANT
GOWN STRL REUS W/ TWL LRG LVL3 (GOWN DISPOSABLE) ×2 IMPLANT
GOWN STRL REUS W/TWL LRG LVL3 (GOWN DISPOSABLE) ×6 IMPLANT
HIBICLENS CHG 4% 4OZ BTL (MISCELLANEOUS) ×3 IMPLANT
KIT TURNOVER CYSTO (KITS) ×3 IMPLANT
KNIFE CARPAL TUNNEL (BLADE) ×3 IMPLANT
LOOP VESSEL MAXI BLUE (MISCELLANEOUS) IMPLANT
NEEDLE HYPO 22GX1.5 SAFETY (NEEDLE) ×4 IMPLANT
NS IRRIG 500ML POUR BTL (IV SOLUTION) ×3 IMPLANT
PACK BASIN DAY SURGERY FS (CUSTOM PROCEDURE TRAY) ×3 IMPLANT
PAD CAST 4YDX4 CTTN HI CHSV (CAST SUPPLIES) ×2 IMPLANT
PAD CAST CTTN 4X4 STRL (SOFTGOODS) IMPLANT
PADDING CAST ABS 4INX4YD NS (CAST SUPPLIES) ×3
PADDING CAST ABS COTTON 4X4 ST (CAST SUPPLIES) ×2 IMPLANT
PADDING CAST COTTON 4X4 STRL (CAST SUPPLIES)
PADDING CAST COTTON 4X4 STRL (SOFTGOODS)
SLING ARM FOAM STRAP LRG (SOFTGOODS) ×1 IMPLANT
SPLINT FAST PLASTER 5X30 (CAST SUPPLIES)
SPLINT FIBERGLASS 3X35 (CAST SUPPLIES) ×3 IMPLANT
SPLINT PLASTER CAST FAST 5X30 (CAST SUPPLIES) IMPLANT
SPLINT PLASTER CAST XFAST 3X15 (CAST SUPPLIES) IMPLANT
SPLINT PLASTER XTRA FASTSET 3X (CAST SUPPLIES)
SUCTION FRAZIER HANDLE 10FR (MISCELLANEOUS)
SUCTION TUBE FRAZIER 10FR DISP (MISCELLANEOUS) IMPLANT
SUT BONE WAX W31G (SUTURE) IMPLANT
SUT ETHILON 4 0 PS 2 18 (SUTURE) ×3 IMPLANT
SUT FIBERWIRE 2-0 18 17.9 3/8 (SUTURE)
SUT PROLENE 4 0 PS 2 18 (SUTURE) IMPLANT
SUT VIC AB 3-0 FS2 27 (SUTURE) IMPLANT
SUTURE FIBERWR 2-0 18 17.9 3/8 (SUTURE) IMPLANT
SYR 10ML LL (SYRINGE) ×4 IMPLANT
SYR BULB EAR ULCER 3OZ GRN STR (SYRINGE) ×3 IMPLANT
TAPE SURG TRANSPORE 1 IN (GAUZE/BANDAGES/DRESSINGS) ×2 IMPLANT
TAPE SURGICAL TRANSPORE 1 IN (GAUZE/BANDAGES/DRESSINGS)
TOWEL OR 17X26 10 PK STRL BLUE (TOWEL DISPOSABLE) ×3 IMPLANT
TRAY DSU PREP LF (CUSTOM PROCEDURE TRAY) ×3 IMPLANT
TUBE CONNECTING 12X1/4 (SUCTIONS) IMPLANT
UNDERPAD 30X36 HEAVY ABSORB (UNDERPADS AND DIAPERS) ×3 IMPLANT

## 2021-04-14 NOTE — Addendum Note (Signed)
Addendum  created 04/14/21 1346 by Justice Rocher, CRNA   Charge Capture section accepted

## 2021-04-14 NOTE — Op Note (Signed)
OPERATIVE NOTE  DATE OF PROCEDURE: 04/14/21   SURGEONS:  Primary: Izell Quinebaug, MD  PREOPERATIVE DIAGNOSIS: Right cubital tunnel syndrome, Right carpal tunnel syndrome  POSTOPERATIVE DIAGNOSIS: Same  NAME OF PROCEDURE:   Right cubital tunnel release, in situ decompression of ulnar nerve Right carpal tunnel syndrome  ANESTHESIA: Local + MAC  SKIN PREPARATION: Hibiclens  ESTIMATED BLOOD LOSS: Minimal  IMPLANTS: none  INDICATIONS: [name] is a 66 y.o. who has the diagnosis of right cubital and carpal tunnel syndrome. The patient has decided to proceed with surgical intervention.  Risks, benefits and alternatives of operative management were discussed including, but not limited to, risks of anesthesia complications, infection, pain, persistent symptoms, stiffness, need for future surgery.  The patient understands, agrees and elects to proceed with surgery.    DESCRIPTION OF PROCEDURE: The patient was met in the pre-operative area and their identity was verified.  The operative location and laterality was also verified and marked.  The patient was brought to the OR and was placed supine on the table.  After repeat patient identification with the operative team anesthesia was provided and the patient was prepped and draped in the usual sterile fashion.  A final timeout was performed verifying the correction patient, procedure, location and laterality.  The right upper extremity was elevated and exsanguinated with an esmarch and tourniquet inflated to 22mHg. An incision was made over the medial elbow at the cubital tunnel. Skin and subcutaneous tissues were divided and the medial antebrachial cutaneous nerves were protected. The roof of the cubital tunnel was incised between the medial epicondyle and the olecranon. The ulnar nerve was decompressed distally and proximally taking care to release the superficial and deep fascia of the FCU as well as the compressive fibers proximally.  There was complete release of the ulnar nerve. The elbow was taken through a range of motion and there was no subluxation of the nerve. The wound was thoroughly irrigated with normal saline and closed with 4-0 nylon suture in horizontal mattress fashion.  A standard 1.5 cm incision was made in the midpalm.  This was carried down through the subcutaneous tissues and palmar fascia to the transverse carpal ligament.  The distal one-half of the transverse carpal ligament was incised longitudinally under direct vision using a 15 blade.  The carpal tunnel release guide was then placed under direct vision on the transverse carpal ligament and slid proximally.  The guide was palpated into appropriate alignment longitudinally.  Contact with the transverse carpal ligament was maintained throughout passing.  The blade was engaged into the guide and the remaining portion of the transverse carpal ligament released completely.  No other abnormalities were noted.  The wound was copiously irrigated and the skin closed using horizontal mattress 4-0 nylon sutures.  A light bulky dressing was placed over the hand and elbow. At the end of the case all counts were correct x2. The patient tolerated the procedure well, was awoken from anesthesia and brought to PACU in stable condition.   [name] MD

## 2021-04-14 NOTE — Anesthesia Postprocedure Evaluation (Signed)
Anesthesia Post Note  Patient: [name]  Procedure(s) Performed: Right cubital tunnel release in situ, (Right: Elbow) Right Carpal Tunnel Release (Right: Hand)     Patient location during evaluation: PACU Anesthesia Type: Regional and MAC Level of consciousness: awake and alert Pain management: pain level controlled Vital Signs Assessment: post-procedure vital signs reviewed and stable Respiratory status: spontaneous breathing, nonlabored ventilation and respiratory function stable Cardiovascular status: stable and blood pressure returned to baseline Postop Assessment: no apparent nausea or vomiting Anesthetic complications: no   No notable events documented.  Last Vitals:  Vitals:   04/14/21 1014 04/14/21 1015  BP: 140/76 137/79  Pulse: 86 83  Resp: 15 19  Temp:    SpO2: 94% 96%    Last Pain:  Vitals:   04/14/21 1015  TempSrc:   PainSc: 0-No pain                 Cinthya Bors,W. EDMOND

## 2021-04-14 NOTE — Anesthesia Procedure Notes (Signed)
Procedure Name: MAC Date/Time: 04/14/2021 9:53 AM Performed by: Justice Rocher, CRNA Pre-anesthesia Checklist: Timeout performed, Patient being monitored, Suction available, Emergency Drugs available and Patient identified Patient Re-evaluated:Patient Re-evaluated prior to induction Oxygen Delivery Method: Simple face mask Preoxygenation: Pre-oxygenation with 100% oxygen Induction Type: IV induction Placement Confirmation: breath sounds checked- equal and bilateral, CO2 detector and positive ETCO2

## 2021-04-14 NOTE — Anesthesia Procedure Notes (Signed)
Anesthesia Regional Block: Supraclavicular block   Pre-Anesthetic Checklist: , timeout performed,  Correct Patient, Correct Site, Correct Laterality,  Correct Procedure, Correct Position, site marked,  Risks and benefits discussed,  Pre-op evaluation,  At surgeon's request and post-op pain management  Laterality: Right  Prep: Maximum Sterile Barrier Precautions used, Betadine       Needles:  Injection technique: Single-shot  Needle Type: Other     Needle Length: 9cm  Needle Gauge: 21     Additional Needles:   Procedures:,,,, ultrasound used (permanent image in chart),,    Narrative:  Start time: 04/14/2021 8:30 AM End time: 04/14/2021 8:40 AM Injection made incrementally with aspirations every 5 mL.  Performed by: Personally  Anesthesiologist: Roderic Palau, MD

## 2021-04-14 NOTE — Transfer of Care (Signed)
Immediate Anesthesia Transfer of Care Note  Patient: [name]  Procedure(s) Performed: Procedure(s) (LRB): Right cubital tunnel release in situ, (Right) Right Carpal Tunnel Release (Right)  Patient Location: PACU  Anesthesia Type: MAC  Level of Consciousness: awake, sedated, patient cooperative and responds to stimulation  Airway & Oxygen Therapy: Patient Spontanous Breathing and Patient connected to face mask oxygen  Post-op Assessment: Report given to PACU RN, Post -op Vital signs reviewed and stable and Patient moving all extremities  Post vital signs: Reviewed and stable  Complications: No apparent anesthesia complications

## 2021-04-14 NOTE — Interval H&P Note (Signed)
History and Physical Interval Note:  04/14/2021 8:23 AM  [name]  has presented today for surgery, with the diagnosis of Right Carpal Tunnel Syndrome, Right cubital tunnel syndrome.  The various methods of treatment have been discussed with the patient and family. After consideration of risks, benefits and other options for treatment, the patient has consented to  Procedure(s) with comments: Right cubital tunnel release in situ, possible transposition (Right) - with MAC anesthesia Needs 60 minutes Right Carpal Tunnel Release (Right) as a surgical intervention.  The patient's history has been reviewed, patient examined, no change in status, stable for surgery.  I have reviewed the patient's chart and labs.  Questions were answered to the patient's satisfaction.     [name]

## 2021-04-14 NOTE — Progress Notes (Signed)
Assisted Dr. Edmond Fitzgerald with right, ultrasound guided, supraclavicular block. Side rails up, monitors on throughout procedure. See vital signs in flow sheet. Tolerated Procedure well. 

## 2021-04-14 NOTE — Anesthesia Postprocedure Evaluation (Signed)
Anesthesia Post Note  Patient: [name]  Procedure(s) Performed: Right cubital tunnel release in situ, (Right: Elbow) Right Carpal Tunnel Release (Right: Hand)     Anesthesia Post Evaluation No notable events documented.  Last Vitals:  Vitals:   04/14/21 1014 04/14/21 1015  BP: 140/76   Pulse: 86 83  Resp: 15 19  Temp:    SpO2: 94% 96%    Last Pain:  Vitals:   04/14/21 1000  TempSrc:   PainSc: 0-No pain                 Analy Bassford

## 2021-04-14 NOTE — Anesthesia Preprocedure Evaluation (Addendum)
Anesthesia Evaluation  Patient identified by MRN, date of birth, ID band Patient awake    Reviewed: Allergy & Precautions, H&P , NPO status , Patient's Chart, lab work & pertinent test results  Airway Mallampati: III  TM Distance: >3 FB Neck ROM: Full    Dental no notable dental hx. (+) Teeth Intact, Dental Advisory Given   Pulmonary neg pulmonary ROS,    Pulmonary exam normal breath sounds clear to auscultation       Cardiovascular + CAD, + Past MI and + Cardiac Stents   Rhythm:Regular Rate:Normal     Neuro/Psych negative neurological ROS  negative psych ROS   GI/Hepatic negative GI ROS, Neg liver ROS,   Endo/Other  diabetes, Type 2, Oral Hypoglycemic AgentsHypothyroidism   Renal/GU negative Renal ROS  negative genitourinary   Musculoskeletal  (+) Arthritis , Osteoarthritis,    Abdominal   Peds  Hematology negative hematology ROS (+)   Anesthesia Other Findings   Reproductive/Obstetrics negative OB ROS                            Anesthesia Physical Anesthesia Plan  ASA: 3  Anesthesia Plan: MAC and Regional   Post-op Pain Management: Tylenol PO (pre-op)   Induction: Intravenous  PONV Risk Score and Plan: 2 and Propofol infusion, Midazolam and Ondansetron  Airway Management Planned: Natural Airway and Simple Face Mask  Additional Equipment:   Intra-op Plan:   Post-operative Plan:   Informed Consent: I have reviewed the patients History and Physical, chart, labs and discussed the procedure including the risks, benefits and alternatives for the proposed anesthesia with the patient or authorized representative who has indicated his/her understanding and acceptance.     Dental advisory given  Plan Discussed with: CRNA  Anesthesia Plan Comments:         Anesthesia Quick Evaluation

## 2021-04-15 ENCOUNTER — Encounter (HOSPITAL_BASED_OUTPATIENT_CLINIC_OR_DEPARTMENT_OTHER): Payer: Self-pay | Admitting: Orthopedic Surgery

## 2021-07-18 DEATH — deceased
